# Patient Record
Sex: Female | Born: 1995
Health system: Southern US, Community
[De-identification: ages and names within clinical notes are randomized; demographics above are authoritative.]

## PROBLEM LIST (undated history)

## (undated) DIAGNOSIS — D649 Anemia, unspecified: Secondary | ICD-10-CM

## (undated) DIAGNOSIS — K589 Irritable bowel syndrome without diarrhea: Secondary | ICD-10-CM

## (undated) DIAGNOSIS — G932 Benign intracranial hypertension: Secondary | ICD-10-CM

## (undated) DIAGNOSIS — K644 Residual hemorrhoidal skin tags: Secondary | ICD-10-CM

## (undated) DIAGNOSIS — G43909 Migraine, unspecified, not intractable, without status migrainosus: Secondary | ICD-10-CM

## (undated) DIAGNOSIS — J45909 Unspecified asthma, uncomplicated: Secondary | ICD-10-CM

## (undated) DIAGNOSIS — F419 Anxiety disorder, unspecified: Secondary | ICD-10-CM

## (undated) HISTORY — DX: Anemia, unspecified: D64.9

## (undated) HISTORY — DX: Benign intracranial hypertension: G93.2

## (undated) HISTORY — DX: Residual hemorrhoidal skin tags: K64.4

## (undated) HISTORY — PX: WISDOM TOOTH EXTRACTION: SHX21

## (undated) HISTORY — DX: Migraine, unspecified, not intractable, without status migrainosus: G43.909

## (undated) HISTORY — DX: Anxiety disorder, unspecified: F41.9

## (undated) HISTORY — PX: OTHER SURGICAL HISTORY: SHX169

---
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
## 2016-02-15 ENCOUNTER — Ambulatory Visit (HOSPITAL_COMMUNITY)
Admission: EM | Admit: 2016-02-15 | Discharge: 2016-02-15 | Disposition: A | Discharge status: Home / Self Care | Payer: BLUE CROSS/BLUE SHIELD | Attending: Family Medicine | Admitting: Family Medicine

## 2016-02-15 ENCOUNTER — Encounter (HOSPITAL_COMMUNITY): Payer: Self-pay | Admitting: *Deleted

## 2016-02-15 DIAGNOSIS — S0093XA Contusion of unspecified part of head, initial encounter: Secondary | ICD-10-CM | POA: Diagnosis not present

## 2016-02-15 DIAGNOSIS — S80211A Abrasion, right knee, initial encounter: Secondary | ICD-10-CM
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
## 2016-02-15 NOTE — Discharge Instructions (Signed)
If you develop any new symptoms or vomiting or worsening headache you need to be reevaluated promptly.

## 2016-03-12 DIAGNOSIS — M9901 Segmental and somatic dysfunction of cervical region: Secondary | ICD-10-CM | POA: Diagnosis not present

## 2016-03-12 DIAGNOSIS — G44209 Tension-type headache, unspecified, not intractable: Secondary | ICD-10-CM | POA: Diagnosis not present

## 2016-03-12 DIAGNOSIS — M542 Cervicalgia: Secondary | ICD-10-CM | POA: Diagnosis not present

## 2016-03-14 DIAGNOSIS — M542 Cervicalgia: Secondary | ICD-10-CM | POA: Diagnosis not present

## 2016-03-14 DIAGNOSIS — M9901 Segmental and somatic dysfunction of cervical region: Secondary | ICD-10-CM | POA: Diagnosis not present

## 2016-03-14 DIAGNOSIS — G44209 Tension-type headache, unspecified, not intractable: Secondary | ICD-10-CM | POA: Diagnosis not present

## 2016-03-14 DIAGNOSIS — M9902 Segmental and somatic dysfunction of thoracic region: Secondary | ICD-10-CM | POA: Diagnosis not present

## 2016-03-16 DIAGNOSIS — G44209 Tension-type headache, unspecified, not intractable: Secondary | ICD-10-CM | POA: Diagnosis not present

## 2016-03-16 DIAGNOSIS — M9901 Segmental and somatic dysfunction of cervical region: Secondary | ICD-10-CM | POA: Diagnosis not present

## 2016-03-16 DIAGNOSIS — M9902 Segmental and somatic dysfunction of thoracic region: Secondary | ICD-10-CM | POA: Diagnosis not present

## 2016-03-16 DIAGNOSIS — M542 Cervicalgia: Secondary | ICD-10-CM | POA: Diagnosis not present

## 2016-03-19 DIAGNOSIS — M542 Cervicalgia: Secondary | ICD-10-CM | POA: Diagnosis not present

## 2016-03-19 DIAGNOSIS — G44209 Tension-type headache, unspecified, not intractable: Secondary | ICD-10-CM | POA: Diagnosis not present

## 2016-03-19 DIAGNOSIS — M9901 Segmental and somatic dysfunction of cervical region: Secondary | ICD-10-CM | POA: Diagnosis not present

## 2016-03-21 DIAGNOSIS — M9901 Segmental and somatic dysfunction of cervical region: Secondary | ICD-10-CM | POA: Diagnosis not present

## 2016-03-21 DIAGNOSIS — G44209 Tension-type headache, unspecified, not intractable: Secondary | ICD-10-CM | POA: Diagnosis not present

## 2016-03-21 DIAGNOSIS — M9902 Segmental and somatic dysfunction of thoracic region: Secondary | ICD-10-CM | POA: Diagnosis not present

## 2016-03-21 DIAGNOSIS — M542 Cervicalgia: Secondary | ICD-10-CM | POA: Diagnosis not present

## 2016-03-28 DIAGNOSIS — M9902 Segmental and somatic dysfunction of thoracic region: Secondary | ICD-10-CM | POA: Diagnosis not present

## 2016-03-28 DIAGNOSIS — G44209 Tension-type headache, unspecified, not intractable: Secondary | ICD-10-CM | POA: Diagnosis not present

## 2016-03-28 DIAGNOSIS — M9901 Segmental and somatic dysfunction of cervical region: Secondary | ICD-10-CM | POA: Diagnosis not present

## 2016-03-29 DIAGNOSIS — M9902 Segmental and somatic dysfunction of thoracic region: Secondary | ICD-10-CM | POA: Diagnosis not present

## 2016-03-29 DIAGNOSIS — M9901 Segmental and somatic dysfunction of cervical region: Secondary | ICD-10-CM | POA: Diagnosis not present

## 2016-03-29 DIAGNOSIS — G44209 Tension-type headache, unspecified, not intractable: Secondary | ICD-10-CM | POA: Diagnosis not present

## 2016-03-29 DIAGNOSIS — M542 Cervicalgia: Secondary | ICD-10-CM | POA: Diagnosis not present

## 2016-04-02 DIAGNOSIS — M542 Cervicalgia: Secondary | ICD-10-CM | POA: Diagnosis not present

## 2016-04-02 DIAGNOSIS — M9901 Segmental and somatic dysfunction of cervical region: Secondary | ICD-10-CM | POA: Diagnosis not present

## 2016-04-02 DIAGNOSIS — G44209 Tension-type headache, unspecified, not intractable: Secondary | ICD-10-CM | POA: Diagnosis not present

## 2016-04-04 DIAGNOSIS — M9902 Segmental and somatic dysfunction of thoracic region: Secondary | ICD-10-CM | POA: Diagnosis not present

## 2016-04-04 DIAGNOSIS — M9901 Segmental and somatic dysfunction of cervical region: Secondary | ICD-10-CM | POA: Diagnosis not present

## 2016-04-04 DIAGNOSIS — G44209 Tension-type headache, unspecified, not intractable: Secondary | ICD-10-CM | POA: Diagnosis not present

## 2016-04-04 DIAGNOSIS — M542 Cervicalgia: Secondary | ICD-10-CM | POA: Diagnosis not present

## 2016-04-09 DIAGNOSIS — M542 Cervicalgia: Secondary | ICD-10-CM | POA: Diagnosis not present

## 2016-04-09 DIAGNOSIS — G44209 Tension-type headache, unspecified, not intractable: Secondary | ICD-10-CM | POA: Diagnosis not present

## 2016-04-09 DIAGNOSIS — M9902 Segmental and somatic dysfunction of thoracic region: Secondary | ICD-10-CM | POA: Diagnosis not present

## 2016-04-09 DIAGNOSIS — M9901 Segmental and somatic dysfunction of cervical region: Secondary | ICD-10-CM | POA: Diagnosis not present

## 2016-07-04 DIAGNOSIS — R05 Cough: Secondary | ICD-10-CM | POA: Diagnosis not present

## 2016-07-04 DIAGNOSIS — J029 Acute pharyngitis, unspecified: Secondary | ICD-10-CM | POA: Diagnosis not present

## 2016-07-04 DIAGNOSIS — Z6830 Body mass index (BMI) 30.0-30.9, adult: Secondary | ICD-10-CM | POA: Diagnosis not present

## 2016-12-24 DIAGNOSIS — R300 Dysuria: Secondary | ICD-10-CM | POA: Diagnosis not present

## 2016-12-24 DIAGNOSIS — Z113 Encounter for screening for infections with a predominantly sexual mode of transmission: Secondary | ICD-10-CM | POA: Diagnosis not present

## 2016-12-24 DIAGNOSIS — Z23 Encounter for immunization: Secondary | ICD-10-CM | POA: Diagnosis not present

## 2016-12-24 DIAGNOSIS — Z124 Encounter for screening for malignant neoplasm of cervix: Secondary | ICD-10-CM | POA: Diagnosis not present

## 2016-12-24 DIAGNOSIS — Z0000 Encounter for general adult medical examination without abnormal findings: Secondary | ICD-10-CM | POA: Diagnosis not present

## 2017-04-01 DIAGNOSIS — R55 Syncope and collapse: Secondary | ICD-10-CM | POA: Diagnosis not present

## 2017-04-01 DIAGNOSIS — R112 Nausea with vomiting, unspecified: Secondary | ICD-10-CM | POA: Diagnosis not present

## 2017-06-24 DIAGNOSIS — Z6829 Body mass index (BMI) 29.0-29.9, adult: Secondary | ICD-10-CM | POA: Diagnosis not present

## 2017-06-24 DIAGNOSIS — H6093 Unspecified otitis externa, bilateral: Secondary | ICD-10-CM | POA: Diagnosis not present

## 2017-07-23 ENCOUNTER — Ambulatory Visit (HOSPITAL_COMMUNITY)
Admission: EM | Admit: 2017-07-23 | Discharge: 2017-07-23 | Disposition: A | Discharge status: Home / Self Care | Payer: BLUE CROSS/BLUE SHIELD | Attending: Family Medicine | Admitting: Family Medicine

## 2017-07-23 ENCOUNTER — Encounter (HOSPITAL_COMMUNITY): Payer: Self-pay | Admitting: Emergency Medicine

## 2017-07-23 DIAGNOSIS — J00 Acute nasopharyngitis [common cold]: Secondary | ICD-10-CM

## 2017-07-23 HISTORY — DX: Unspecified asthma, uncomplicated: J45.909

## 2017-07-23 MED ORDER — CROMOLYN SODIUM 5.2 MG/ACT NA AERS: 1.0000 | INHALATION_SPRAY | Freq: Four times a day (QID) | NASAL | 12 refills | Status: DC

## 2017-07-23 MED ORDER — CETIRIZINE-PSEUDOEPHEDRINE ER 5-120 MG PO TB12: 1.0000 | ORAL_TABLET | Freq: Every day | ORAL | 0 refills | Status: DC

## 2017-07-23 NOTE — ED Triage Notes (Signed)
Pt c/o fever, coughing, sinus issues x3 days. (fever of 99) c/o body aches, HA.

## 2017-07-23 NOTE — Discharge Instructions (Signed)
Push fluids to ensure adequate hydration and keep secretions thin.  Tylenol and/or ibuprofen as needed for pain or fevers.  Nasal spray 4 times a day as needed for congestion symptoms. Daily zyrtec d. If symptoms worsen or do not improve in the next week to return to be seen or to follow up with your primary care provider.

## 2017-07-23 NOTE — ED Provider Notes (Signed)
[facility]    CSN: [id number] Arrival date & time: 07/23/17  1752     History   Chief Complaint Chief Complaint  Patient presents with  . URI    HPI Pamela BirchwoodKrysten Montoya is a 22 y.o. female.   Tonye PearsonKrysten presents with complaints of runny nose, sore throat, headache, body aches, non productive cough, mild nausea and loose stools, which started 3 days ago. Roommates have had similar illness but symptoms improved more quickly. Denies abdominal pain or vomiting. Denies urinary symptoms. Temps of 99. Symptoms have not worsened but have not improved. History of asthma. Mild shortness of breath, without wheezing. Took dayquil today at 1100, Ibuprofen yesterday. Has not helped.    ROS per HPI.       Past Medical History:  Diagnosis Date  . Asthma     There are no active problems to display for this patient.   History reviewed. No pertinent surgical history.  OB History    Gravida Para Term Preterm AB Living   1             SAB TAB Ectopic Multiple Live Births                   Home Medications    Prior to Admission medications   Medication Sig Start Date End Date Taking? Authorizing Provider  albuterol (PROVENTIL HFA;VENTOLIN HFA) 108 (90 Base) MCG/ACT inhaler Inhale into the lungs every 6 (six) hours as needed for wheezing or shortness of breath.   Yes [provider]  cetirizine-pseudoephedrine (ZYRTEC-D) 5-120 MG tablet Take 1 tablet by mouth daily. 07/23/17   Georgetta HaberBurky, Natalie B, NP  cromolyn (NASALCROM) 5.2 MG/ACT nasal spray Place 1 spray into both nostrils 4 (four) times daily. 07/23/17   Georgetta HaberBurky, Natalie B, NP    Family History No family history on file.  Social History Social History   Tobacco Use  . Smoking status: Never Smoker  Substance Use Topics  . Alcohol use: Yes  . Drug use: Not on file     Allergies   Patient has no known allergies.   Review of Systems Review of Systems   Physical Exam Triage Vital Signs ED Triage Vitals  [07/23/17 1803]  Enc Vitals Group     BP 118/77     Pulse Rate 86     Resp 18     Temp 98.4 F (36.9 C)     Temp src      SpO2 100 %     Weight      Height      Head Circumference      Peak Flow      Pain Score 4     Pain Loc      Pain Edu?      Excl. in GC?    No data found.  Updated Vital Signs BP 118/77   Pulse 86   Temp 98.4 F (36.9 C)   Resp 18   LMP 07/09/2017   SpO2 100%   Breastfeeding? Unknown   Visual Acuity Right Eye Distance:   Left Eye Distance:   Bilateral Distance:    Right Eye Near:   Left Eye Near:    Bilateral Near:     Physical Exam  Constitutional: She is oriented to person, place, and time. She appears well-developed and well-nourished. No distress.  HENT:  Head: Normocephalic and atraumatic.  Right Ear: Tympanic membrane, external ear and ear canal normal.  Left Ear: Tympanic membrane, external  ear and ear canal normal.  Nose: Rhinorrhea present. Right sinus exhibits no maxillary sinus tenderness and no frontal sinus tenderness. Left sinus exhibits no maxillary sinus tenderness and no frontal sinus tenderness.  Mouth/Throat: Uvula is midline, oropharynx is clear and moist and mucous membranes are normal. No tonsillar exudate.  Eyes: Conjunctivae and EOM are normal. Pupils are equal, round, and reactive to light.  Cardiovascular: Normal rate, regular rhythm and normal heart sounds.  Pulmonary/Chest: Effort normal and breath sounds normal.  Lymphadenopathy:    She has no cervical adenopathy.  Neurological: She is alert and oriented to person, place, and time.  Skin: Skin is warm and dry.     UC Treatments / Results  Labs (all labs ordered are listed, but only abnormal results are displayed) Labs Reviewed - No data to display  EKG  EKG Interpretation None       Radiology No results found.  Procedures Procedures (including critical care time)  Medications Ordered in UC Medications - No data to display   Initial  Impression / Assessment and Plan / UC Course  I have reviewed the triage vital signs and the nursing notes.  Pertinent labs & imaging results that were available during my care of the patient were reviewed by me and considered in my medical decision making (see chart for details).     Benign physical findings. History and physical consistent with viral illness. Supportive cares recommended. Push fluids to ensure adequate hydration and keep secretions thin. Tylenol and/or ibuprofen as needed for pain or fevers.  Zyrtec d, nasalcrom for congestion. If symptoms worsen or do not improve in the next week to return to be seen or to follow up with PCP.  Patient verbalized understanding and agreeable to plan.    Final Clinical Impressions(s) / UC Diagnoses   Final diagnoses:  Acute nasopharyngitis    ED Discharge Orders        Ordered    cetirizine-pseudoephedrine (ZYRTEC-D) 5-120 MG tablet  Daily     07/23/17 1829    cromolyn (NASALCROM) 5.2 MG/ACT nasal spray  4 times daily     07/23/17 1829       Controlled Substance Prescriptions [place] Controlled Substance Registry consulted? Not Applicable   Georgetta Haber, NP 07/23/17 [phone]

## 2018-01-28 ENCOUNTER — Encounter: Payer: Self-pay | Admitting: Physician Assistant

## 2018-01-28 ENCOUNTER — Ambulatory Visit: Payer: BLUE CROSS/BLUE SHIELD | Admitting: Physician Assistant

## 2018-01-28 ENCOUNTER — Other Ambulatory Visit: Payer: Self-pay

## 2018-01-28 VITALS — BP 116/79 | HR 88 | Temp 99.2°F | Resp 20 | Ht 61.81 in | Wt 169.0 lb

## 2018-01-28 DIAGNOSIS — H1131 Conjunctival hemorrhage, right eye: Secondary | ICD-10-CM | POA: Diagnosis not present

## 2018-01-28 NOTE — Patient Instructions (Addendum)
Apply cold compress to affected area at least 3-5 x a day. Avoid straining eye for the next few days. If you start to experience any headache, vision loss, or vomiting, please seek care immediately.   Subconjunctival Hemorrhage Subconjunctival hemorrhage is bleeding that happens between the white part of your eye (sclera) and the clear membrane that covers the outside of your eye (conjunctiva). There are many tiny blood vessels near the surface of your eye. A subconjunctival hemorrhage happens when one or more of these vessels breaks and bleeds, causing a red patch to appear on your eye. This is similar to a bruise. Depending on the amount of bleeding, the red patch may only cover a small area of your eye or it may cover the entire visible part of the sclera. If a lot of blood collects under the conjunctiva, there may also be swelling. Subconjunctival hemorrhages do not affect your vision or cause pain, but your eye may feel irritated if there is swelling. Subconjunctival hemorrhages usually do not require treatment, and they disappear on their own within two weeks. What are the causes? This condition may be caused by:  Mild trauma, such as rubbing your eye too hard.  Severe trauma or blunt injuries.  Coughing, sneezing, or vomiting.  Straining, such as when lifting a heavy object.  High blood pressure.  Recent eye surgery.  A history of diabetes.  Certain medicines, especially blood thinners (anticoagulants).  Other conditions, such as eye tumors, bleeding disorders, or blood vessel abnormalities.  Subconjunctival hemorrhages can happen without an obvious cause. What are the signs or symptoms? Symptoms of this condition include:  A bright red or dark red patch on the white part of the eye. ? The red area may spread out to cover a larger area of the eye before it goes away. ? The red area may turn brownish-yellow before it goes away.  Swelling.  Mild eye irritation.  How is this  diagnosed? This condition is diagnosed with a physical exam. If your subconjunctival hemorrhage was caused by trauma, your health care provider may refer you to an eye specialist (ophthalmologist) or another specialist to check for other injuries. You may have other tests, including:  An eye exam.  A blood pressure check.  Blood tests to check for bleeding disorders.  If your subconjunctival hemorrhage was caused by trauma, X-rays or a CT scan may be done to check for other injuries. How is this treated? Usually, no treatment is needed. Your health care provider may recommend eye drops or cold compresses to help with discomfort. Follow these instructions at home:  Take over-the-counter and prescription medicines only as directed by your health care provider.  Use eye drops or cold compresses to help with discomfort as directed by your health care provider.  Avoid activities, things, and environments that may irritate or injure your eye.  Keep all follow-up visits as told by your health care provider. This is important. Contact a health care provider if:  You have pain in your eye.  The bleeding does not go away within 3 weeks.  You keep getting new subconjunctival hemorrhages. Get help right away if:  Your vision changes or you have difficulty seeing.  You suddenly develop severe sensitivity to light.  You develop a severe headache, persistent vomiting, confusion, or abnormal tiredness (lethargy).  Your eye seems to bulge or protrude from your eye socket.  You develop unexplained bruises on your body.  You have unexplained bleeding in another area of your  body. This information is not intended to replace advice given to you by your health care provider. Make sure you discuss any questions you have with your health care provider. Document Released: 06/18/2005 Document Revised: 02/12/2016 Document Reviewed: 08/25/2014 Elsevier Interactive Patient Education  2018 [employer]  IF you received an x-ray today, you will receive an invoice from [facility]. Please contact [facility] at [phone] with questions or concerns regarding your invoice.   IF you received labwork today, you will receive an invoice from [facility]. Please contact [facility] at [phone] with questions or concerns regarding your invoice.   Our billing staff will not be able to assist you with questions regarding bills from these companies.  You will be contacted with the lab results as soon as they are available. The fastest way to get your results is to activate your My Chart account. Instructions are located on the last page of this paperwork. If you have not heard from us regarding the results in 2 weeks, please contact this office.

## 2018-01-28 NOTE — Progress Notes (Signed)
Pamela Montoya  MRN: [id number] DOB: [birth year]  Subjective:  Pamela Montoya is a 22 y.o. female seen in office today for a chief complaint of "blood in right eye" x 1 day. Her boyfriend noticed when he got home today for lunch. Has some mild irritation when looking at bright light. Denies pain, foreign body sensation, visual disturbance, headache, discharge, and itching. Denies acute trauma. Denies contact lens use. Has increased her amount of time spent looking at computer screen over the past month. That is what she was doing when her boyfriend came home. Has PMH of pseudotumor cerebri. Denies use of anticoagulation. Denies smoking.   Review of Systems  Constitutional: Negative for chills, diaphoresis and fever.  Gastrointestinal: Negative for nausea and vomiting.  Neurological: Negative for dizziness, numbness and headaches.    There are no active problems to display for this patient.   Current Outpatient Medications on File Prior to Visit  Medication Sig Dispense Refill  . albuterol (PROVENTIL HFA;VENTOLIN HFA) 108 (90 Base) MCG/ACT inhaler Inhale into the lungs every 6 (six) hours as needed for wheezing or shortness of breath.    . cetirizine-pseudoephedrine (ZYRTEC-D) 5-120 MG tablet Take 1 tablet by mouth daily. (Patient not taking: Reported on 01/28/2018) 30 tablet 0  . cromolyn (NASALCROM) 5.2 MG/ACT nasal spray Place 1 spray into both nostrils 4 (four) times daily. (Patient not taking: Reported on 01/28/2018) 26 mL 12   No current facility-administered medications on file prior to visit.     No Known Allergies   Objective:  BP 116/79   Pulse 88   Temp 99.2 F (37.3 C) (Oral)   Resp 20   Ht 5' 1.81" (1.57 m)   Wt 169 lb (76.7 kg)   LMP 01/08/2018 (Exact Date)   SpO2 98%   Breastfeeding? No   BMI 31.10 kg/m   Physical Exam  Constitutional: She is oriented to person, place, and time. She appears well-developed and well-nourished.  HENT:  Head: Normocephalic and  atraumatic.  No pain with palpation of bilateral periorbital regions.   Eyes: Pupils are equal, round, and reactive to light. EOM are normal. Right eye exhibits no discharge and no hordeolum. No foreign body present in the right eye. Right conjunctiva has a hemorrhage (subconjuntival hemorrhage in superior medial aspect of right conjunctiva). Left conjunctiva is not injected. Left conjunctiva has no hemorrhage.  Fundoscopic exam:      The right eye shows no AV nicking, no exudate, no hemorrhage and no papilledema. The right eye shows red reflex.       The left eye shows no AV nicking, no exudate, no hemorrhage and no papilledema. The left eye shows red reflex.  Neck: Normal range of motion.  Pulmonary/Chest: Effort normal.  Neurological: She is alert and oriented to person, place, and time.  Skin: Skin is warm and dry.  Psychiatric: She has a normal mood and affect.  Vitals reviewed.     Visual Acuity Screening   Right eye Left eye Both eyes  Without correction:     With correction: 20/20 20/15 20/15      Assessment and Plan :  1. Subconjunctival hemorrhage of right eye Hx and PE findings consistent with subconjunctival hemorrhage. No red flags noted. Visual acuity normal. Rec rest, avoid screen time for the next few days, and use cool compresses to affected area. Advised to return to clinic if symptoms worsen, do not improve, or as needed.   Benjiman Core PA-C  [facility] at [facility] 01/28/2018 5:26 PM

## 2018-05-06 ENCOUNTER — Ambulatory Visit: Payer: BLUE CROSS/BLUE SHIELD | Admitting: Physician Assistant

## 2018-05-06 ENCOUNTER — Encounter: Payer: Self-pay | Admitting: Physician Assistant

## 2018-05-06 ENCOUNTER — Other Ambulatory Visit: Payer: Self-pay

## 2018-05-06 VITALS — BP 107/71 | HR 86 | Temp 98.5°F | Resp 18 | Ht 61.81 in | Wt 170.6 lb

## 2018-05-06 DIAGNOSIS — N946 Dysmenorrhea, unspecified: Secondary | ICD-10-CM | POA: Diagnosis not present

## 2018-05-06 DIAGNOSIS — R112 Nausea with vomiting, unspecified: Secondary | ICD-10-CM | POA: Diagnosis not present

## 2018-05-06 DIAGNOSIS — H938X3 Other specified disorders of ear, bilateral: Secondary | ICD-10-CM | POA: Diagnosis not present

## 2018-05-06 LAB — POCT URINE PREGNANCY: Preg Test, Ur: NEGATIVE

## 2018-05-06 MED ORDER — ONDANSETRON HCL 4 MG PO TABS: 4.0000 mg | ORAL_TABLET | Freq: Three times a day (TID) | ORAL | 0 refills | Status: DC | PRN

## 2018-05-06 MED ORDER — CELECOXIB 200 MG PO CAPS
ORAL_CAPSULE | ORAL | 1 refills | Status: DC
Start: 2018-05-06 — End: 2018-05-16

## 2018-05-06 NOTE — Patient Instructions (Addendum)
Please follow up with gynecology.  For future cramps, use celebrex as prescribed. Do not take with ibuprofen. May use zofran as needed for nausea.      If you have lab work done today you will be contacted with your lab results within the next 2 weeks.  If you have not heard from [place] then please contact us. The fastest way to get your results is to register for My Chart.   Dysmenorrhea Dysmenorrhea means painful cramps during your period (menstrual period). You will have pain in your lower belly (abdomen). The pain is caused by the tightening (contracting) of the muscles of the womb (uterus). The pain may be mild or very bad. With this condition, you may:  Have a headache.  Feel sick to your stomach (nauseous).  Throw up (vomit).  Have lower back pain.  Follow these instructions at home: Helping pain and cramping  Put heat on your lower back or belly when you have pain or cramps. Use the heat source that your doctor tells you to use. ? Place a towel between your skin and the heat. ? Leave the heat on for 20-30 minutes. ? Remove the heat if your skin turns bright red. This is especially important if you cannot feel pain, heat, or cold. ? Do not have a heating pad on during sleep.  Do aerobic exercises. These include walking, swimming, or biking. These may help with cramps.  Massage your lower back or belly. This may help lessen pain. General instructions  Take over-the-counter and prescription medicines only as told by your doctor.  Do not drive or use heavy machinery while taking prescription pain medicine.  Avoid alcohol and caffeine during and right before your period. These can make cramps worse.  Do not use any products that have nicotine or tobacco. These include cigarettes and e-cigarettes. If you need help quitting, ask your doctor.  Keep all follow-up visits as told by your doctor. This is important. Contact a doctor if:  You have pain that gets worse.  You have  pain that does not get better with medicine.  You have pain during sex.  You feel sick to your stomach or you throw up during your period, and medicine does not help. Get help right away if:  You pass out (faint). Summary  Dysmenorrhea means painful cramps during your period (menstrual period).  Put heat on your lower back or belly when you have pain or cramps.  Do exercises like walking, swimming, or biking to help with cramps.  Contact a doctor if you have pain during sex. This information is not intended to replace advice given to you by your health care provider. Make sure you discuss any questions you have with your health care provider. Document Released: 09/14/2008 Document Revised: 07/05/2016 Document Reviewed: 07/05/2016 Elsevier Interactive Patient Education  2017 [employer].  IF you received an x-ray today, you will receive an invoice from [facility]. Please contact [facility] at [phone] with questions or concerns regarding your invoice.   IF you received labwork today, you will receive an invoice from [facility]. Please contact [facility] at [phone] with questions or concerns regarding your invoice.   Our billing staff will not be able to assist you with questions regarding bills from these companies.  You will be contacted with the lab results as soon as they are available. The fastest way to get your results is to activate your My Chart account. Instructions are located on the last page of this  paperwork. If you have not heard from [place] regarding the results in 2 weeks, please contact this office.

## 2018-05-06 NOTE — Progress Notes (Signed)
MRN: [id number] DOB: 04/26/1996  Subjective:   Pamela Montoya is a 22 y.o. female presenting for chief complaint of Dysmenorrhea (having vomiting episodes when she has her cycle since august also having some pressure in ears for 2 weeks ) .  Having nausea and vomiting during menstrual cycle since they started when she was a teenager. Seemed to be more manageable but for the past 2 months it has worsened again. Has very bad cramps and heavy cycles, has to take a lot of ibuprofen to control it, it does help.  Cycles last 5-6 days, uses 3-6 pads a day. They are regular, occurring every 28 days. LMP 05/03/18.  Pain, heaviness, nausea, and vomiting are worse on days 2 through 4 of her cycle.  Has tried birth control in the past and this did help but was told not to use these anymore due to her pseudotumor cerebri.  She would like a referral to gynecology to discuss further options.  She is sexually active with monogamous boyfriend.  No concern for STDs as she just had this recently.  Having some denies any other aggravating or relieving factors, no other questions or concerns.  Review of Systems  Constitutional: Negative for chills and fever.  HENT: Positive for congestion. Negative for ear discharge, ear pain, hearing loss and tinnitus.        +ear fullness for ~2 weeks  Neurological: Negative for dizziness.    Pamela Montoya has a current medication list which includes the following prescription(s): albuterol, celecoxib, and ondansetron. Also has No Known Allergies.  Pamela Montoya  has a past medical history of Asthma and Pseudotumor cerebri (2007). Also  has no past surgical history on file.   Objective:   Vitals: BP 107/71   Pulse 86   Temp 98.5 F (36.9 C) (Oral)   Resp 18   Ht 5' 1.81" (1.57 m)   Wt 170 lb 9.6 oz (77.4 kg)   LMP 04/29/2018   SpO2 98%   BMI 31.40 kg/m   Physical Exam  Constitutional: She is oriented to person, place, and time. She appears well-developed and  well-nourished. No distress.  HENT:  Head: Normocephalic and atraumatic.  Right Ear: Tympanic membrane, external ear and ear canal normal. No tenderness. Tympanic membrane is not erythematous and not bulging. No middle ear effusion.  Left Ear: Tympanic membrane, external ear and ear canal normal. No tenderness. Tympanic membrane is not erythematous and not bulging.  No middle ear effusion.  Eyes: Conjunctivae are normal.  Neck: Normal range of motion.  Cardiovascular: Normal rate, regular rhythm and normal heart sounds.  Pulmonary/Chest: Effort normal.  Abdominal: Soft. Bowel sounds are normal. She exhibits no mass. There is no tenderness. There is no guarding.  Lymphadenopathy:       Head (right side): No submental, no submandibular, no tonsillar, no preauricular, no posterior auricular and no occipital adenopathy present.       Head (left side): No submental, no submandibular, no tonsillar, no preauricular, no posterior auricular and no occipital adenopathy present.    She has no cervical adenopathy.  Neurological: She is alert and oriented to person, place, and time.  Skin: Skin is warm and dry.  Psychiatric: She has a normal mood and affect.  Vitals reviewed.   Results for orders placed or performed in visit on 05/06/18 (from the past 24 hour(s))  POCT urine pregnancy     Status: None   Collection Time: 05/06/18  5:04 PM  Result Value Ref Range  Preg Test, Ur Negative Negative    Assessment and Plan :  1. Nausea and vomiting, intractability of vomiting not specified, unspecified vomiting type - POCT urine pregnancy - ondansetron (ZOFRAN) 4 MG tablet; Take 1 tablet (4 mg total) by mouth every 8 (eight) hours as needed for nausea or vomiting.  Dispense: 20 tablet; Refill: 0 - Ambulatory referral to Gynecology 2. Dysmenorrhea Patient is asymptomatic today but history is consistent with moderate to severe dysmenorrhea.  Will attempt trial of Celebrex and Zofran as needed during her  menstrual cycles.  She has had success with OCP in the past.  Has past medical history of pseudotumor cerebri and prefers to discuss her options for further management of dysmenorrhea with a gynecologist.  Referral has been placed.  Follow-up here as needed. - celecoxib (CELEBREX) 200 MG capsule; Take 2 tablets by mouth on day one of menses (may take 1 additional tablet on day one if needed), then take 1 tablet twice daily for remainder of menstrual cycle.  Dispense: 30 capsule; Refill: 1 - Ambulatory referral to Gynecology  3. Ear fullness, bilateral Normal physical exam findings.  Likely due to patient seasonal allergies.   Benjiman Core, PA-C  [facility] at [facility] 05/06/2018 5:05 PM

## 2018-05-12 ENCOUNTER — Ambulatory Visit: Payer: BLUE CROSS/BLUE SHIELD | Admitting: Physician Assistant

## 2018-05-16 ENCOUNTER — Encounter: Payer: Self-pay | Admitting: Gynecology

## 2018-05-16 ENCOUNTER — Ambulatory Visit: Payer: BLUE CROSS/BLUE SHIELD | Admitting: Gynecology

## 2018-05-16 VITALS — BP 118/68 | Ht 61.0 in | Wt 171.0 lb

## 2018-05-16 DIAGNOSIS — N906 Unspecified hypertrophy of vulva: Secondary | ICD-10-CM | POA: Diagnosis not present

## 2018-05-16 DIAGNOSIS — R112 Nausea with vomiting, unspecified: Secondary | ICD-10-CM

## 2018-05-16 DIAGNOSIS — N946 Dysmenorrhea, unspecified: Secondary | ICD-10-CM

## 2018-05-16 DIAGNOSIS — Z01419 Encounter for gynecological examination (general) (routine) without abnormal findings: Secondary | ICD-10-CM

## 2018-05-16 MED ORDER — IBUPROFEN 800 MG PO TABS: 800.0000 mg | ORAL_TABLET | Freq: Three times a day (TID) | ORAL | 1 refills | Status: DC | PRN

## 2018-05-16 NOTE — Patient Instructions (Signed)
Follow up for the ultrasound and H pylori test as scheduled

## 2018-05-16 NOTE — Progress Notes (Signed)
Pamela Montoya 10/27/1995 [id number]        22 y.o.  G0P0 new patient presents complaining of nausea/vomiting with her menses.  Patient notes that she is always had some nausea with her menses since menarche but since August she has had multiple episodes of vomiting for several days during her cycle.  Has regular monthly menses.  Condom contraception.  Had tried oral contraceptives in the past historically and had nausea with them.  She does note though between her menses she has some lingering nausea and diarrhea.  History of pseudotumor cerebri  Past medical history,surgical history, problem list, medications, allergies, family history and social history were all reviewed and documented as reviewed in the EPIC chart.  ROS:  Performed with pertinent positives and negatives included in the history, assessment and plan.   Additional significant findings : None   Exam: Pamela PortelaKim Montoya assistant Vitals:   05/16/18 1110  BP: 118/68  Weight: 171 lb (77.6 kg)  Height: 5\' 1"  (1.549 m)   Body mass index is 32.31 kg/m.  General appearance:  Normal affect, orientation and appearance. Skin: Grossly normal HEENT: Without gross lesions.  No cervical or supraclavicular adenopathy. Thyroid normal.  Lungs:  Clear without wheezing, rales or rhonchi Cardiac: RR, without RMG Abdominal:  Soft, nontender, without masses, guarding, rebound, organomegaly or hernia Breasts:  Examined lying and sitting without masses, retractions, discharge or axillary adenopathy. Pelvic:  Ext, BUS, Vagina: With left labia minora hypertrophy.  Cervix: Normal  Uterus: Anteverted, normal size, shape and contour, midline and mobile nontender   Adnexa: Without masses or tenderness    Anus and perineum: Normal     Assessment/Plan:  22 y.o. G0P0 female for annual gynecologic exam with regular menses, condom contraception.   1. Nausea and vomiting with her menses worsening over the last several cycles.  Some underlying nausea  in between with diarrhea.  Discussed possible GI etiology exacerbated with her menses.  Also having significant dysmenorrhea.  Several different options were reviewed with the patient.  Ultimately we will start with baseline ultrasound rule out nonpalpable abnormalities.  Her sister has a diagnosis of endometriosis.  Discussed possible cause for her dysmenorrhea.  Limits of ultrasound and diagnosis of endometriosis was also discussed with options for diagnostic laparoscopy.  We will start with pain management with ibuprofen 800 mg every 8 hours started at the very onset of cramping and to continue for several days.  Will see if this does not also help with her nausea and blunting prostaglandin release.  Given that she has some nausea in between her menses possibilities for GI disease discussed.  She was diagnosed with IBS in the past.  Will have her do an H. pylori breath test when she returns for the ultrasound to rule out etiology for her symptoms.  Will consider GI referral if continues to be an issue.  Lastly we discussed low-dose oral contraceptives for menstrual suppression and hopefully symptom relief.  Did have issues with nausea with a trial of birth control pills.  They were unclear whether this was related to her pseudotumor cerebri.  I discussed trial of 1/10 pill is the lowest estrogen-based pill out there to see if this does not help.  Will rediscuss this when she returns for her ultrasound. 2. Breast health.  Breast exam normal today.  SBE monthly reviewed. 3. Left labial hypertrophy.  Onset with puberty.  Stable as far as no ongoing enlargement.  Options for management to include expectant versus surgical discussed.  Patient's comfortable with observation at this time as it does not bother her. 4. Reports Gardasil series received. 5. STD screening discussed and offered.  Patient declined.  Relates testing before her current relationship which is monogamous. 6. Health maintenance.  No routine lab  work done as patient does this through her primary provider.  Follow-up for ultrasound and H. pylori testing.   Pamela Lords MD, 11:34 AM 05/16/2018

## 2018-06-16 ENCOUNTER — Other Ambulatory Visit: Payer: Self-pay | Admitting: Gynecology

## 2018-06-16 ENCOUNTER — Ambulatory Visit: Payer: BLUE CROSS/BLUE SHIELD

## 2018-06-16 ENCOUNTER — Ambulatory Visit (INDEPENDENT_AMBULATORY_CARE_PROVIDER_SITE_OTHER): Payer: BLUE CROSS/BLUE SHIELD | Admitting: Gynecology

## 2018-06-16 ENCOUNTER — Encounter: Payer: Self-pay | Admitting: Gynecology

## 2018-06-16 VITALS — BP 118/72

## 2018-06-16 DIAGNOSIS — N831 Corpus luteum cyst of ovary, unspecified side: Secondary | ICD-10-CM

## 2018-06-16 DIAGNOSIS — N946 Dysmenorrhea, unspecified: Secondary | ICD-10-CM

## 2018-06-16 NOTE — Patient Instructions (Signed)
Follow-up with your decision as far as contraception.

## 2018-06-16 NOTE — Progress Notes (Signed)
    Pamela BirchwoodKrysten Montoya 07/18/1995 [id number]        22 y.o.  G0P0 presents for ultrasound.  History of dysmenorrhea.  Past medical history,surgical history, problem list, medications, allergies, family history and social history were all reviewed and documented in the EPIC chart.  Directed ROS with pertinent positives and negatives documented in the history of present illness/assessment and plan.  Exam: Vitals:   06/16/18 1157  BP: 118/72   General appearance:  Normal  Ultrasound transvaginal shows uterus normal size and echotexture.  Endometrial echo 11 mm, tri layered.  Right and left ovaries normal.  Classic appearing corpus luteal cyst on left ovary measuring 2518 mm.  Cul-de-sac negative  Assessment/Plan:  [age] y.o. G0P0 with normal GYN ultrasound.  Reviewed with patient the ultrasound findings.  Does not rule out more subtle endometriosis but no gross evidence of endometriomas or other pathology.  Options for management to include expectant versus hormonal suppression discussed.  She does not want to try birth control pills as she had a bad experience with nausea and vomiting.  We did discuss possibly trying the low-dose LoLoestrin 1/10 but she is not interested at this point.  Alternatives to include trial of NuvaRing as well as Depo-Provera, Nexplanon and Mirena IUD.  The pros and cons of each choice discussed.  The patient wants to think of her options and to research more on her own.  We did give her a sample of NuvaRing to take home with her in the event that she would like to try this.  How to use this was discussed with her and the potential side effects to include nausea and vomiting despite transvaginal absorption as well as thrombosis risks.  Patient will follow-up with her ultimate decision.    Dara Lordsimothy P Fontaine MD, 12:10 PM 06/16/2018

## 2018-08-27 ENCOUNTER — Ambulatory Visit: Payer: BLUE CROSS/BLUE SHIELD | Admitting: Family Medicine

## 2019-01-14 ENCOUNTER — Other Ambulatory Visit: Payer: Self-pay | Admitting: Physician Assistant

## 2019-03-23 ENCOUNTER — Encounter: Payer: Self-pay | Admitting: Gynecology

## 2019-06-23 ENCOUNTER — Other Ambulatory Visit: Payer: Self-pay | Admitting: Physician Assistant

## 2019-06-23 ENCOUNTER — Other Ambulatory Visit: Payer: Self-pay | Admitting: Family Medicine

## 2019-06-23 NOTE — Telephone Encounter (Signed)
celecoxib (CELEBREX) 200 MG capsule   Patient requesting refill.    Pharmacy:  [facility] [place], [place] [facility] [place] Phone:  [phone]  Fax:  [phone]

## 2019-07-13 ENCOUNTER — Telehealth (INDEPENDENT_AMBULATORY_CARE_PROVIDER_SITE_OTHER): Payer: 59 | Admitting: Registered Nurse

## 2019-07-13 ENCOUNTER — Other Ambulatory Visit: Payer: Self-pay

## 2019-07-13 ENCOUNTER — Encounter: Payer: Self-pay | Admitting: Registered Nurse

## 2019-07-13 VITALS — Temp 99.0°F | Ht 60.0 in | Wt 175.0 lb

## 2019-07-13 DIAGNOSIS — J111 Influenza due to unidentified influenza virus with other respiratory manifestations: Secondary | ICD-10-CM | POA: Diagnosis not present

## 2019-07-13 MED ORDER — OSELTAMIVIR PHOSPHATE 75 MG PO CAPS: 75.0000 mg | ORAL_CAPSULE | Freq: Two times a day (BID) | ORAL | 0 refills | Status: DC

## 2019-07-14 ENCOUNTER — Other Ambulatory Visit: Payer: Self-pay

## 2019-07-14 ENCOUNTER — Ambulatory Visit (HOSPITAL_COMMUNITY)
Admission: EM | Admit: 2019-07-14 | Discharge: 2019-07-14 | Disposition: A | Discharge status: Home / Self Care | Payer: 59 | Attending: Urgent Care | Admitting: Urgent Care

## 2019-07-14 ENCOUNTER — Encounter (HOSPITAL_COMMUNITY): Payer: Self-pay | Admitting: Emergency Medicine

## 2019-07-14 DIAGNOSIS — R509 Fever, unspecified: Secondary | ICD-10-CM | POA: Insufficient documentation

## 2019-07-14 DIAGNOSIS — K589 Irritable bowel syndrome without diarrhea: Secondary | ICD-10-CM | POA: Diagnosis not present

## 2019-07-14 DIAGNOSIS — Z79899 Other long term (current) drug therapy: Secondary | ICD-10-CM | POA: Insufficient documentation

## 2019-07-14 DIAGNOSIS — R110 Nausea: Secondary | ICD-10-CM | POA: Diagnosis not present

## 2019-07-14 DIAGNOSIS — R040 Epistaxis: Secondary | ICD-10-CM | POA: Diagnosis not present

## 2019-07-14 DIAGNOSIS — R748 Abnormal levels of other serum enzymes: Secondary | ICD-10-CM | POA: Insufficient documentation

## 2019-07-14 DIAGNOSIS — J45909 Unspecified asthma, uncomplicated: Secondary | ICD-10-CM | POA: Diagnosis not present

## 2019-07-14 DIAGNOSIS — R000 Tachycardia, unspecified: Secondary | ICD-10-CM | POA: Insufficient documentation

## 2019-07-14 DIAGNOSIS — R42 Dizziness and giddiness: Secondary | ICD-10-CM | POA: Insufficient documentation

## 2019-07-14 DIAGNOSIS — Z791 Long term (current) use of non-steroidal anti-inflammatories (NSAID): Secondary | ICD-10-CM | POA: Insufficient documentation

## 2019-07-14 DIAGNOSIS — R5381 Other malaise: Secondary | ICD-10-CM | POA: Insufficient documentation

## 2019-07-14 DIAGNOSIS — Z20822 Contact with and (suspected) exposure to covid-19: Secondary | ICD-10-CM | POA: Diagnosis not present

## 2019-07-14 DIAGNOSIS — B349 Viral infection, unspecified: Secondary | ICD-10-CM | POA: Insufficient documentation

## 2019-07-14 DIAGNOSIS — R5383 Other fatigue: Secondary | ICD-10-CM | POA: Insufficient documentation

## 2019-07-14 DIAGNOSIS — M791 Myalgia, unspecified site: Secondary | ICD-10-CM | POA: Insufficient documentation

## 2019-07-14 DIAGNOSIS — N920 Excessive and frequent menstruation with regular cycle: Secondary | ICD-10-CM

## 2019-07-14 HISTORY — DX: Irritable bowel syndrome, unspecified: K58.9

## 2019-07-14 LAB — COMPREHENSIVE METABOLIC PANEL
ALT: 178 U/L — ABNORMAL HIGH (ref 0–44)
AST: 162 U/L — ABNORMAL HIGH (ref 15–41)
Albumin: 3.9 g/dL (ref 3.5–5.0)
Alkaline Phosphatase: 178 U/L — ABNORMAL HIGH (ref 38–126)
Anion gap: 8 (ref 5–15)
BUN: 5 mg/dL — ABNORMAL LOW (ref 6–20)
CO2: 24 mmol/L (ref 22–32)
Calcium: 9.3 mg/dL (ref 8.9–10.3)
Chloride: 103 mmol/L (ref 98–111)
Creatinine, Ser: 0.71 mg/dL (ref 0.44–1.00)
GFR calc Af Amer: >60 mL/min (ref >60)
GFR calc non Af Amer: >60 mL/min (ref >60)
Glucose, Bld: 97 mg/dL (ref 70–99)
Potassium: 3.8 mmol/L (ref 3.5–5.1)
Sodium: 135 mmol/L (ref 135–145)
Total Bilirubin: 0.6 mg/dL (ref 0.3–1.2)
Total Protein: 7.2 g/dL (ref 6.5–8.1)

## 2019-07-14 LAB — POC SARS CORONAVIRUS 2 AG: SARS Coronavirus 2 Ag: NEGATIVE

## 2019-07-14 LAB — POCT URINALYSIS DIP (DEVICE)
Bilirubin Urine: NEGATIVE
Glucose, UA: NEGATIVE mg/dL
Ketones, ur: NEGATIVE mg/dL
Leukocytes,Ua: NEGATIVE
Nitrite: NEGATIVE
Protein, ur: 30 mg/dL — AB
Specific Gravity, Urine: 1.01 (ref 1.005–1.030)
Urobilinogen, UA: 0.2 mg/dL (ref 0.0–1.0)
pH: 7 (ref 5.0–8.0)

## 2019-07-14 LAB — CBC WITH DIFFERENTIAL/PLATELET
Abs Immature Granulocytes: 0 10*3/uL (ref 0.00–0.07)
Basophils Absolute: 0 10*3/uL (ref 0.0–0.1)
Basophils Relative: 1 %
Eosinophils Absolute: 0 10*3/uL (ref 0.0–0.5)
Eosinophils Relative: 0 %
HCT: 36.3 % (ref 36.0–46.0)
Hemoglobin: 12.1 g/dL (ref 12.0–15.0)
Lymphocytes Relative: 43 %
Lymphs Abs: 1.9 10*3/uL (ref 0.7–4.0)
MCH: 28.3 pg (ref 26.0–34.0)
MCHC: 33.3 g/dL (ref 30.0–36.0)
MCV: 85 fL (ref 80.0–100.0)
Monocytes Absolute: 0.4 10*3/uL (ref 0.1–1.0)
Monocytes Relative: 8 %
Myelocytes: 1 %
Neutro Abs: 2.1 10*3/uL (ref 1.7–7.7)
Neutrophils Relative %: 47 %
Platelets: 175 10*3/uL (ref 150–400)
RBC: 4.27 MIL/uL (ref 3.87–5.11)
RDW: 13.9 % (ref 11.5–15.5)
WBC: 4.4 10*3/uL (ref 4.0–10.5)
nRBC: 0 % (ref 0.0–0.2)
nRBC: 0 /100 WBC

## 2019-07-14 LAB — POC SARS CORONAVIRUS 2 AG -  ED: SARS Coronavirus 2 Ag: NEGATIVE

## 2019-07-14 MED ORDER — ONDANSETRON 8 MG PO TBDP: 8.0000 mg | ORAL_TABLET | Freq: Three times a day (TID) | ORAL | 0 refills | Status: DC | PRN

## 2019-07-14 MED ORDER — SODIUM CHLORIDE 0.9 % IV BOLUS
1000.0000 mL | Freq: Once | INTRAVENOUS | Status: AC
  Administered 2019-07-14: 1000 mL via INTRAVENOUS

## 2019-07-14 NOTE — ED Provider Notes (Signed)
[place]   MRN: [id number] DOB: [birth year]  Subjective:   Pamela Montoya is a 24 y.o. female presenting for 5-day history acute onset of persistent and worsening moderate to severe malaise including fevers, body aches, feeling woozy, nausea.  Patient was being managed by her PCP for influenza, no testing was done.  She did test negative for COVID-19, received results on July 12, 2019.  States that she has been pushing fluids at home as advised by her PCP.  Has also done general supportive care.  Today, patient states that she felt worse and had a severe nosebleed.  She states that she believes she lost a lot of blood as it persisted and lasted for a couple of hours.  Denies history of chronic nosebleeds.  Also reports that she had her cycle this past week and states that it was much heavier than it is normally.  Denies chest pain, shortness of breath, abdominal pain.  No current facility-administered medications for this encounter.  Current Outpatient Medications:  .  albuterol (PROVENTIL HFA;VENTOLIN HFA) 108 (90 Base) MCG/ACT inhaler, Inhale into the lungs every 6 (six) hours as needed for wheezing or shortness of breath., Disp: , Rfl:  .  celecoxib (CELEBREX) 200 MG capsule, Take 1 capsule (200 mg total) by mouth daily. TAKE 2 CAPSULES BY MOUTH ON DAY 1 OF MENSES(MAY TAKE 1 ADDITIONAL ON DAY 1 IF NEEDED); THEN TAKE 1 CAPSULE TWICE DAILY FOR REMAINDER OF CYCLE, Disp: 30 capsule, Rfl: 0 .  Phenylephrine-Pheniramine-DM (THERAFLU COLD & COUGH PO), Take by mouth., Disp: , Rfl:  .  ibuprofen (ADVIL,MOTRIN) 800 MG tablet, Take 1 tablet (800 mg total) by mouth every 8 (eight) hours as needed., Disp: 60 tablet, Rfl: 1 .  ondansetron (ZOFRAN) 4 MG tablet, Take 1 tablet (4 mg total) by mouth every 8 (eight) hours as needed for nausea or vomiting., Disp: 20 tablet, Rfl: 0 .  oseltamivir (TAMIFLU) 75 MG capsule, Take 1 capsule (75 mg total) by mouth 2 (two) times daily., Disp: 10 capsule, Rfl:  0   No Known Allergies  Past Medical History:  Diagnosis Date  . Asthma   . IBS (irritable bowel syndrome)   . Pseudotumor cerebri 2007     Past Surgical History:  Procedure Laterality Date  . Spinal tap      Family History  Problem Relation Age of Onset  . Hypertension Father   . Heart defect Sister     Social History   Tobacco Use  . Smoking status: Never Smoker  . Smokeless tobacco: Never Used  Substance Use Topics  . Alcohol use: Yes    Comment: occ  . Drug use: Never    Review of Systems  Constitutional: Positive for malaise/fatigue. Negative for fever.  HENT: Positive for nosebleeds. Negative for congestion, ear pain, sinus pain and sore throat.   Eyes: Negative for discharge and redness.  Respiratory: Negative for cough, hemoptysis, shortness of breath and wheezing.   Cardiovascular: Negative for chest pain.  Gastrointestinal: Positive for nausea. Negative for abdominal pain, diarrhea and vomiting.  Genitourinary: Negative for dysuria, flank pain and hematuria.  Musculoskeletal: Positive for myalgias.  Skin: Negative for rash.  Neurological: Positive for dizziness. Negative for weakness and headaches.  Psychiatric/Behavioral: Negative for depression and substance abuse.     Objective:   Vitals: BP 97/66 (BP Location: Right Arm)   Pulse (!) 124   Temp 98.9 F (37.2 C) (Oral)   Resp 18   LMP 07/10/2019   SpO2  97%   Wt Readings from Last 3 Encounters:  07/13/19 175 lb (79.4 kg)  05/16/18 171 lb (77.6 kg)  05/06/18 170 lb 9.6 oz (77.4 kg)   Temp Readings from Last 3 Encounters:  07/14/19 98.9 F (37.2 C) (Oral)  07/13/19 99 F (37.2 C) (Oral)  05/06/18 98.5 F (36.9 C) (Oral)   BP Readings from Last 3 Encounters:  07/14/19 97/66  06/16/18 118/72  05/16/18 118/68   Pulse Readings from Last 3 Encounters:  07/14/19 (!) 124  05/06/18 86  01/28/18 88   Pulse is 105 on recheck.   Physical Exam Constitutional:      General: She is not  in acute distress.    Appearance: Normal appearance. She is well-developed and normal weight. She is not ill-appearing, toxic-appearing or diaphoretic.  HENT:     Head: Normocephalic and atraumatic.     Right Ear: External ear normal.     Left Ear: External ear normal.     Nose: Nose normal.     Mouth/Throat:     Mouth: Mucous membranes are moist.     Pharynx: Oropharynx is clear.  Eyes:     General: No scleral icterus.    Extraocular Movements: Extraocular movements intact.     Pupils: Pupils are equal, round, and reactive to light.  Cardiovascular:     Rate and Rhythm: Regular rhythm. Tachycardia present.     Pulses: Normal pulses.     Heart sounds: Normal heart sounds. No murmur. No friction rub. No gallop.   Pulmonary:     Effort: Pulmonary effort is normal. No respiratory distress.     Breath sounds: Normal breath sounds. No stridor. No wheezing, rhonchi or rales.  Abdominal:     General: Bowel sounds are normal. There is no distension.     Palpations: Abdomen is soft. There is no mass.     Tenderness: There is no abdominal tenderness. There is no right CVA tenderness, left CVA tenderness, guarding or rebound.  Skin:    General: Skin is warm and dry.     Coloration: Skin is not pale.     Findings: No rash.  Neurological:     General: No focal deficit present.     Mental Status: She is alert and oriented to person, place, and time.     Cranial Nerves: No cranial nerve deficit.     Motor: No weakness.     Coordination: Coordination normal.     Gait: Gait normal.     Deep Tendon Reflexes: Reflexes normal.  Psychiatric:        Mood and Affect: Mood normal.        Behavior: Behavior normal.        Thought Content: Thought content normal.        Judgment: Judgment normal.    Results for orders placed or performed during the hospital encounter of 07/14/19 (from the past 24 hour(s))  CBC with Differential     Status: None   Collection Time: 07/14/19 10:20 AM  Result Value  Ref Range   WBC 4.4 4.0 - 10.5 K/uL   RBC 4.27 3.87 - 5.11 MIL/uL   Hemoglobin 12.1 12.0 - 15.0 g/dL   HCT 36.3 36.0 - 46.0 %   MCV 85.0 80.0 - 100.0 fL   MCH 28.3 26.0 - 34.0 pg   MCHC 33.3 30.0 - 36.0 g/dL   RDW 13.9 11.5 - 15.5 %   Platelets 175 150 - 400 K/uL   nRBC 0.0  0.0 - 0.2 %   Neutrophils Relative % 47 %   Neutro Abs 2.1 1.7 - 7.7 K/uL   Lymphocytes Relative 43 %   Lymphs Abs 1.9 0.7 - 4.0 K/uL   Monocytes Relative 8 %   Monocytes Absolute 0.4 0.1 - 1.0 K/uL   Eosinophils Relative 0 %   Eosinophils Absolute 0.0 0.0 - 0.5 K/uL   Basophils Relative 1 %   Basophils Absolute 0.0 0.0 - 0.1 K/uL   nRBC 0 0 /100 WBC   Myelocytes 1 %   Abs Immature Granulocytes 0.00 0.00 - 0.07 K/uL  Comprehensive metabolic panel     Status: Abnormal   Collection Time: 07/14/19 10:20 AM  Result Value Ref Range   Sodium 135 135 - 145 mmol/L   Potassium 3.8 3.5 - 5.1 mmol/L   Chloride 103 98 - 111 mmol/L   CO2 24 22 - 32 mmol/L   Glucose, Bld 97 70 - 99 mg/dL   BUN 5 (L) 6 - 20 mg/dL   Creatinine, Ser 0.71 0.44 - 1.00 mg/dL   Calcium 9.3 8.9 - 10.3 mg/dL   Total Protein 7.2 6.5 - 8.1 g/dL   Albumin 3.9 3.5 - 5.0 g/dL   AST 162 (H) 15 - 41 U/L   ALT 178 (H) 0 - 44 U/L   Alkaline Phosphatase 178 (H) 38 - 126 U/L   Total Bilirubin 0.6 0.3 - 1.2 mg/dL   GFR calc non Af Amer >60 >60 mL/min   GFR calc Af Amer >60 >60 mL/min   Anion gap 8 5 - 15  POC SARS Coronavirus 2 Ag-ED - Nasal Swab (BD Veritor Kit)     Status: None   Collection Time: 07/14/19 10:35 AM  Result Value Ref Range   SARS Coronavirus 2 Ag NEGATIVE NEGATIVE  POC SARS Coronavirus 2 Ag     Status: None   Collection Time: 07/14/19 10:35 AM  Result Value Ref Range   SARS Coronavirus 2 Ag NEGATIVE NEGATIVE  POCT urinalysis dip (device)     Status: Abnormal   Collection Time: 07/14/19 11:56 AM  Result Value Ref Range   Glucose, UA NEGATIVE NEGATIVE mg/dL   Bilirubin Urine NEGATIVE NEGATIVE   Ketones, ur NEGATIVE NEGATIVE  mg/dL   Specific Gravity, Urine 1.010 1.005 - 1.030   Hgb urine dipstick LARGE (A) NEGATIVE   pH 7.0 5.0 - 8.0   Protein, ur 30 (A) NEGATIVE mg/dL   Urobilinogen, UA 0.2 0.0 - 1.0 mg/dL   Nitrite NEGATIVE NEGATIVE   Leukocytes,Ua NEGATIVE NEGATIVE   ED ECG REPORT   Date: 07/14/2019  Rate: 105bpm  Rhythm: sinus tachycardia  QRS Axis: normal  Intervals: normal  ST/T Wave abnormalities: normal  Conduction Disutrbances:none  Narrative Interpretation: Sinus tachycardia at 105bpm, no ecg for comparison.   Old EKG Reviewed: none available  I have personally reviewed the EKG tracing and agree with the computerized printout as noted.  Patient given 1 L of normal saline IV fluids.  Assessment and Plan :   1. Viral syndrome   2. Epistaxis   3. Tachycardia   4. Myalgia   5. Malaise and fatigue   6. Elevated liver enzymes     Patient had elevated liver enzymes, admits that she has been using a lot of Tylenol lately.  At this time, her blood pressure and tachycardia have improved with IV fluids.  EKG is reassuring, CBC is also reassuring.  She does not have an electrolyte disturbance and renal function is  normal.  Counseled patient that I suspect she is undergoing a undifferentiated viral syndrome, COVID-19 confirmation testing pending.  Physical exam findings stable for discharge.  Maintain close follow-up with PCP, strict ER precautions. Counseled patient on potential for adverse effects with medications prescribed/recommended today, ER and return-to-clinic precautions discussed, patient verbalized understanding.    Jaynee Eagles, PA-C 07/14/19 1250

## 2019-07-14 NOTE — Progress Notes (Signed)
Telemedicine Encounter- SOAP NOTE Established Patient  This telephone encounter was conducted with the patient's (or proxy's) verbal consent via audio telecommunications: yes  Patient was instructed to have this encounter in a suitably private space; and to only have persons present to whom they give permission to participate. In addition, patient identity was confirmed by use of name plus two identifiers (DOB and address).  I discussed the limitations, risks, security and privacy concerns of performing an evaluation and management service by telephone and the availability of in person appointments. I also discussed with the patient that there may be a patient responsible charge related to this service. The patient expressed understanding and agreed to proceed.  I spent a total of 12 minutes talking with the patient or their proxy.  Chief Complaint  Patient presents with  . Fatigue    fatigue, bodyache, and fever for x5 days. Was covid tetsed 05/09/20 which was negative.    Subjective   Pamela Montoya is a 24 y.o. established patient. Telephone visit today for flu like symptoms.  HPI Pt reports symptoms have occurred for five days. Was COVID tested on Jan 8 - negative. Body aches, fatigues, and fever. Has been out from work. No known sick contacts. No GI symptoms. No known sick contacts. Mild respiratory symptoms. Has been trying supportive care.   There are no problems to display for this patient.   Past Medical History:  Diagnosis Date  . Asthma   . Pseudotumor cerebri 2007    Current Outpatient Medications  Medication Sig Dispense Refill  . albuterol (PROVENTIL HFA;VENTOLIN HFA) 108 (90 Base) MCG/ACT inhaler Inhale into the lungs every 6 (six) hours as needed for wheezing or shortness of breath.    . celecoxib (CELEBREX) 200 MG capsule Take 1 capsule (200 mg total) by mouth daily. TAKE 2 CAPSULES BY MOUTH ON DAY 1 OF MENSES(MAY TAKE 1 ADDITIONAL ON DAY 1 IF NEEDED); THEN  TAKE 1 CAPSULE TWICE DAILY FOR REMAINDER OF CYCLE 30 capsule 0  . ibuprofen (ADVIL,MOTRIN) 800 MG tablet Take 1 tablet (800 mg total) by mouth every 8 (eight) hours as needed. 60 tablet 1  . ondansetron (ZOFRAN) 4 MG tablet Take 1 tablet (4 mg total) by mouth every 8 (eight) hours as needed for nausea or vomiting. 20 tablet 0  . oseltamivir (TAMIFLU) 75 MG capsule Take 1 capsule (75 mg total) by mouth 2 (two) times daily. 10 capsule 0   No current facility-administered medications for this visit.    No Known Allergies  Social History   Socioeconomic History  . Marital status: Single    Spouse name: Not on file  . Number of children: 0  . Years of education: Not on file  . Highest education level: Not on file  Occupational History  . Not on file  Tobacco Use  . Smoking status: Never Smoker  . Smokeless tobacco: Never Used  Substance and Sexual Activity  . Alcohol use: Yes    Comment: occ  . Drug use: Not on file  . Sexual activity: Yes    Birth control/protection: Condom    Comment: 1st intercourse 24 yo-More than 5 partners  Other Topics Concern  . Not on file  Social History Narrative  . Not on file   Social Determinants of Health   Financial Resource Strain:   . Difficulty of Paying Living Expenses: Not on file  Food Insecurity:   . Worried About Charity fundraiser in the Last Year: Not  on file  . Ran Out of Food in the Last Year: Not on file  Transportation Needs:   . Lack of Transportation (Medical): Not on file  . Lack of Transportation (Non-Medical): Not on file  Physical Activity:   . Days of Exercise per Week: Not on file  . Minutes of Exercise per Session: Not on file  Stress:   . Feeling of Stress : Not on file  Social Connections:   . Frequency of Communication with Friends and Family: Not on file  . Frequency of Social Gatherings with Friends and Family: Not on file  . Attends Religious Services: Not on file  . Active Member of Clubs or  Organizations: Not on file  . Attends Banker Meetings: Not on file  . Marital Status: Not on file  Intimate Partner Violence:   . Fear of Current or Ex-Partner: Not on file  . Emotionally Abused: Not on file  . Physically Abused: Not on file  . Sexually Abused: Not on file    ROS Per hpi   Objective   Vitals as reported by the patient: Today's Vitals   07/13/19 1029  Temp: 99 F (37.2 C)  TempSrc: Oral  SpO2: 99%  Weight: 175 lb (79.4 kg)  Height: 5' (1.524 m)    Pamela Montoya was seen today for fatigue.  Diagnoses and all orders for this visit:  Influenza -     oseltamivir (TAMIFLU) 75 MG capsule; Take 1 capsule (75 mg total) by mouth 2 (two) times daily.   PLAN  Discussed tamiflu and its role as antiviral  Otherwise, pt will keep performing supportive care and isolating.  Pt demonstrates good understanding of this care and reasons to seek further care.  Patient encouraged to call clinic with any questions, comments, or concerns.   I discussed the assessment and treatment plan with the patient. The patient was provided an opportunity to ask questions and all were answered. The patient agreed with the plan and demonstrated an understanding of the instructions.   The patient was advised to call back or seek an in-person evaluation if the symptoms worsen or if the condition fails to improve as anticipated.  I provided 12 minutes of non-face-to-face time during this encounter.  Janeece Agee, NP  Primary Care at [facility]

## 2019-07-14 NOTE — ED Triage Notes (Signed)
Nosebleed started at 8:30 am today.    Patient reports her pcp is treating her for flu-like symptoms.  Patient states she tested negative for covid on  07/10/2019, received results on 07/12/2019

## 2019-07-14 NOTE — Discharge Instructions (Addendum)
We will manage this as a viral syndrome. For sore throat or cough try using a honey-based tea. Use 3 teaspoons of honey with juice squeezed from half lemon. Place shaved pieces of ginger into 1/2-1 cup of water and warm over stove top. Then mix the ingredients and repeat every 4 hours as needed. Please take ibuprofen 600mg -800mg  every 8 hours as needed. Hydrate very well with at least 2 liters of water. Eat light meals such as soups to replenish electrolytes and soft fruits, veggies. Start an antihistamine like Zyrtec (cetirizine) at 10mg  daily for postnasal drainage, sinus congestion.

## 2019-07-16 LAB — NOVEL CORONAVIRUS, NAA (HOSP ORDER, SEND-OUT TO REF LAB; TAT 18-24 HRS): SARS-CoV-2, NAA: NOT DETECTED

## 2019-07-22 ENCOUNTER — Ambulatory Visit (INDEPENDENT_AMBULATORY_CARE_PROVIDER_SITE_OTHER): Payer: 59 | Admitting: Registered Nurse

## 2019-07-22 ENCOUNTER — Encounter: Payer: Self-pay | Admitting: Registered Nurse

## 2019-07-22 ENCOUNTER — Other Ambulatory Visit: Payer: Self-pay

## 2019-07-22 VITALS — BP 106/66 | HR 100 | Temp 97.7°F | Ht 60.0 in | Wt 177.4 lb

## 2019-07-22 DIAGNOSIS — R748 Abnormal levels of other serum enzymes: Secondary | ICD-10-CM | POA: Diagnosis not present

## 2019-07-22 DIAGNOSIS — Z130 Encounter for screening for diseases of the blood and blood-forming organs and certain disorders involving the immune mechanism: Secondary | ICD-10-CM

## 2019-07-22 DIAGNOSIS — N946 Dysmenorrhea, unspecified: Secondary | ICD-10-CM | POA: Diagnosis not present

## 2019-07-22 DIAGNOSIS — Z13228 Encounter for screening for other metabolic disorders: Secondary | ICD-10-CM

## 2019-07-22 DIAGNOSIS — Z13220 Encounter for screening for lipoid disorders: Secondary | ICD-10-CM

## 2019-07-22 DIAGNOSIS — Z1329 Encounter for screening for other suspected endocrine disorder: Secondary | ICD-10-CM | POA: Diagnosis not present

## 2019-07-22 MED ORDER — CELECOXIB 200 MG PO CAPS: 200.0000 mg | ORAL_CAPSULE | Freq: Every day | ORAL | 0 refills | Status: DC

## 2019-07-22 NOTE — Progress Notes (Signed)
Established Patient Office Visit  Subjective:  Patient ID: Pamela Montoya, female    DOB: 15-May-1996  Age: 24 y.o. MRN: [id number]  CC:  Chief Complaint  Patient presents with  . Transitions Of Care    establish care , also have a lump behind her left ear for about 3 days.    HPI Jovan Schickling presents for visit to establish care.   Two concerns today:   Mass behind left ear. Onset three days ago. Not changing. No discoloration of skin. No drainage or discharge. Has not happened before. Not painful. No changes to hearing or other effect on ear.   Irregular menses: missed menses in December, heavy menses in January. Normal duration but heavy bleeding, heavier cramping. No clotting. No other sxs. Has not happened before. Reports around menarche had some irregularities but more regular over the years.  Otherwise feeling good. Much improved since URI symptoms last week. Some fevers in the evening managed with acetaminophen. At an in person visit last week, notably elevated LFTs noted. Will repeat. Otherwise had a normal CBC at that time.  Past Medical History:  Diagnosis Date  . Asthma   . IBS (irritable bowel syndrome)   . Pseudotumor cerebri 2007    Past Surgical History:  Procedure Laterality Date  . Spinal tap    . WISDOM TOOTH EXTRACTION      Family History  Problem Relation Age of Onset  . Hypertension Father   . Lung cancer Maternal Grandmother   . Heart attack Maternal Grandfather   . Dementia Maternal Grandfather   . Colon cancer Paternal Grandmother   . Heart defect Sister     Social History   Socioeconomic History  . Marital status: Significant Other    Spouse name: Not on file  . Number of children: 0  . Years of education: Not on file  . Highest education level: Not on file  Occupational History  . Not on file  Tobacco Use  . Smoking status: Never Smoker  . Smokeless tobacco: Never Used  Substance and Sexual Activity  . Alcohol use: Yes   Comment: occ  . Drug use: Never  . Sexual activity: Yes    Birth control/protection: Condom    Comment: 1st intercourse 24 yo-More than 5 partners  Other Topics Concern  . Not on file  Social History Narrative  . Not on file   Social Determinants of Health   Financial Resource Strain:   . Difficulty of Paying Living Expenses: Not on file  Food Insecurity:   . Worried About Charity fundraiser in the Last Year: Not on file  . Ran Out of Food in the Last Year: Not on file  Transportation Needs:   . Lack of Transportation (Medical): Not on file  . Lack of Transportation (Non-Medical): Not on file  Physical Activity:   . Days of Exercise per Week: Not on file  . Minutes of Exercise per Session: Not on file  Stress:   . Feeling of Stress : Not on file  Social Connections:   . Frequency of Communication with Friends and Family: Not on file  . Frequency of Social Gatherings with Friends and Family: Not on file  . Attends Religious Services: Not on file  . Active Member of Clubs or Organizations: Not on file  . Attends Archivist Meetings: Not on file  . Marital Status: Not on file  Intimate Partner Violence:   . Fear of Current or Ex-Partner: Not  on file  . Emotionally Abused: Not on file  . Physically Abused: Not on file  . Sexually Abused: Not on file    Outpatient Medications Prior to Visit  Medication Sig Dispense Refill  . albuterol (PROVENTIL HFA;VENTOLIN HFA) 108 (90 Base) MCG/ACT inhaler Inhale into the lungs every 6 (six) hours as needed for wheezing or shortness of breath.    Marland Kitchen ibuprofen (ADVIL,MOTRIN) 800 MG tablet Take 1 tablet (800 mg total) by mouth every 8 (eight) hours as needed. 60 tablet 1  . ondansetron (ZOFRAN) 4 MG tablet Take 1 tablet (4 mg total) by mouth every 8 (eight) hours as needed for nausea or vomiting. 20 tablet 0  . ondansetron (ZOFRAN-ODT) 8 MG disintegrating tablet Take 1 tablet (8 mg total) by mouth every 8 (eight) hours as needed for  nausea or vomiting. 30 tablet 0  . celecoxib (CELEBREX) 200 MG capsule Take 1 capsule (200 mg total) by mouth daily. TAKE 2 CAPSULES BY MOUTH ON DAY 1 OF MENSES(MAY TAKE 1 ADDITIONAL ON DAY 1 IF NEEDED); THEN TAKE 1 CAPSULE TWICE DAILY FOR REMAINDER OF CYCLE 30 capsule 0  . oseltamivir (TAMIFLU) 75 MG capsule Take 1 capsule (75 mg total) by mouth 2 (two) times daily. (Patient not taking: Reported on 07/22/2019) 10 capsule 0  . Phenylephrine-Pheniramine-DM (THERAFLU COLD & COUGH PO) Take by mouth.     No facility-administered medications prior to visit.    No Known Allergies  ROS Review of Systems  Constitutional: Negative.   HENT: Negative.   Eyes: Negative.   Respiratory: Negative.   Cardiovascular: Negative.   Gastrointestinal: Negative.   Endocrine: Negative.   Genitourinary: Negative.   Musculoskeletal: Negative.   Skin: Negative.   Allergic/Immunologic: Negative.   Neurological: Negative.   Hematological: Negative.   Psychiatric/Behavioral: Negative.   All other systems reviewed and are negative.     Objective:    Physical Exam  Constitutional: She is oriented to person, place, and time. She appears well-developed and well-nourished. No distress.  HENT:  Head: Normocephalic and atraumatic.  Right Ear: External ear normal.  Left Ear: External ear normal.  Neck: No thyromegaly present.  Cardiovascular: Normal rate, regular rhythm, normal heart sounds and intact distal pulses. Exam reveals no gallop and no friction rub.  No murmur heard. Pulmonary/Chest: Effort normal and breath sounds normal. No respiratory distress. She has no wheezes. She has no rales. She exhibits no tenderness.  Musculoskeletal:     Cervical back: Normal range of motion.  Lymphadenopathy:       Head (left side): Posterior auricular (mildly swollen, firm, nontender, mobile. ) adenopathy present.  Neurological: She is alert and oriented to person, place, and time.  Skin: Skin is warm and dry. No  rash noted. She is not diaphoretic. No erythema. No pallor.  Psychiatric: She has a normal mood and affect. Her behavior is normal. Judgment and thought content normal.  Nursing note and vitals reviewed.   BP 106/66   Pulse 100   Temp 97.7 F (36.5 C) (Temporal)   Ht 5' (1.524 m)   Wt 177 lb 6.4 oz (80.5 kg)   LMP 07/13/2019   SpO2 96%   BMI 34.65 kg/m  Wt Readings from Last 3 Encounters:  07/22/19 177 lb 6.4 oz (80.5 kg)  07/13/19 175 lb (79.4 kg)  05/16/18 171 lb (77.6 kg)     Health Maintenance Due  Topic Date Due  . HIV Screening  01/06/2011  . PAP-Cervical Cytology Screening  01/05/2017  .  PAP SMEAR-Modifier  01/05/2017  . INFLUENZA VACCINE  01/31/2019    There are no preventive care reminders to display for this patient.  No results found for: TSH Lab Results  Component Value Date   WBC 4.4 07/14/2019   HGB 12.1 07/14/2019   HCT 36.3 07/14/2019   MCV 85.0 07/14/2019   PLT 175 07/14/2019   Lab Results  Component Value Date   NA 135 07/14/2019   K 3.8 07/14/2019   CO2 24 07/14/2019   GLUCOSE 97 07/14/2019   BUN 5 (L) 07/14/2019   CREATININE 0.71 07/14/2019   BILITOT 0.6 07/14/2019   ALKPHOS 178 (H) 07/14/2019   AST 162 (H) 07/14/2019   ALT 178 (H) 07/14/2019   PROT 7.2 07/14/2019   ALBUMIN 3.9 07/14/2019   CALCIUM 9.3 07/14/2019   ANIONGAP 8 07/14/2019   No results found for: CHOL No results found for: HDL No results found for: LDLCALC No results found for: TRIG No results found for: CHOLHDL No results found for: DGLO7F    Assessment & Plan:   Problem List Items Addressed This Visit    None    Visit Diagnoses    Elevated liver enzymes    -  Primary   Relevant Orders   Hepatic Function Panel   Screening for endocrine, metabolic and immunity disorder       Relevant Orders   TSH   Lipid screening       Relevant Orders   Lipid Panel   Menstrual cramps       Relevant Medications   celecoxib (CELEBREX) 200 MG capsule      Meds  ordered this encounter  Medications  . celecoxib (CELEBREX) 200 MG capsule    Sig: Take 1 capsule (200 mg total) by mouth daily. TAKE 2 CAPSULES BY MOUTH ON DAY 1 OF MENSES(MAY TAKE 1 ADDITIONAL ON DAY 1 IF NEEDED); THEN TAKE 1 CAPSULE TWICE DAILY FOR REMAINDER OF CYCLE    Dispense:  30 capsule    Refill:  0    Order Specific Question:   Supervising Provider    Answer:   Doristine Bosworth [id number]    Follow-up: No follow-ups on file.   PLAN  Swelling of posterior auricular lymph node on L side. Likely related to recent infection. I suspect this will resolve in the coming days to weeks. If it does not resolve or if it worsens, she should return to clinic for further workup  Pt has tried COCs in the past but did not like side effects - does not want to try them again. She will monitor her menses, if irregularities continue, will consider BCM to help control this or consider referral to Gyn for management.  History of pseudotumor on optic nerve - no changes for some time. Discharged by Kateri Mc, now follows with ophthalmology regularly.  TSH, lipids, hepatic function panel drawn. Will follow up as warranted. If LFTs continue to be elevated, will recheck again or consider imaging, further labs, or specialist referral.  Otherwise no concerns at this time Janeece Agee, NP

## 2019-07-22 NOTE — Patient Instructions (Signed)
° ° ° °  If you have lab work done today you will be contacted with your lab results within the next 2 weeks.  If you have not heard from us then please contact us. The fastest way to get your results is to register for My Chart. ° ° °IF you received an x-ray today, you will receive an invoice from [facility]. Please contact [facility] at [phone] with questions or concerns regarding your invoice.  ° °IF you received labwork today, you will receive an invoice from [facility]. Please contact [facility] at [phone] with questions or concerns regarding your invoice.  ° °Our billing staff will not be able to assist you with questions regarding bills from these companies. ° °You will be contacted with the lab results as soon as they are available. The fastest way to get your results is to activate your My Chart account. Instructions are located on the last page of this paperwork. If you have not heard from us regarding the results in 2 weeks, please contact this office. °  ° ° ° °

## 2019-07-23 LAB — HEPATIC FUNCTION PANEL
ALT: 342 IU/L — ABNORMAL HIGH (ref 0–32)
AST: 156 IU/L — ABNORMAL HIGH (ref 0–40)
Albumin: 4.1 g/dL (ref 3.9–5.0)
Alkaline Phosphatase: 475 IU/L — ABNORMAL HIGH (ref 39–117)
Bilirubin Total: 0.3 mg/dL (ref 0.0–1.2)
Bilirubin, Direct: 0.16 mg/dL (ref 0.00–0.40)
Total Protein: 7.7 g/dL (ref 6.0–8.5)

## 2019-07-23 LAB — LIPID PANEL
Chol/HDL Ratio: 4.7 ratio — ABNORMAL HIGH (ref 0.0–4.4)
Cholesterol, Total: 140 mg/dL (ref 100–199)
HDL: 30 mg/dL — ABNORMAL LOW (ref >39)
LDL Chol Calc (NIH): 78 mg/dL (ref 0–99)
Triglycerides: 187 mg/dL — ABNORMAL HIGH (ref 0–149)
VLDL Cholesterol Cal: 32 mg/dL (ref 5–40)

## 2019-07-23 LAB — TSH: TSH: 2.08 u[IU]/mL (ref 0.450–4.500)

## 2019-07-24 ENCOUNTER — Other Ambulatory Visit: Payer: Self-pay | Admitting: Registered Nurse

## 2019-07-24 ENCOUNTER — Ambulatory Visit (HOSPITAL_COMMUNITY)
Admission: RE | Admit: 2019-07-24 | Discharge: 2019-07-24 | Disposition: A | Discharge status: Home / Self Care | Payer: 59 | Source: Ambulatory Visit | Attending: Registered Nurse | Admitting: Registered Nurse

## 2019-07-24 ENCOUNTER — Telehealth: Payer: Self-pay | Admitting: *Deleted

## 2019-07-24 ENCOUNTER — Encounter: Payer: Self-pay | Admitting: Registered Nurse

## 2019-07-24 ENCOUNTER — Other Ambulatory Visit: Payer: Self-pay

## 2019-07-24 ENCOUNTER — Encounter: Payer: Self-pay | Admitting: Gastroenterology

## 2019-07-24 ENCOUNTER — Telehealth: Payer: Self-pay | Admitting: Registered Nurse

## 2019-07-24 ENCOUNTER — Telehealth: Payer: Self-pay

## 2019-07-24 ENCOUNTER — Ambulatory Visit: Payer: 59

## 2019-07-24 DIAGNOSIS — R7989 Other specified abnormal findings of blood chemistry: Secondary | ICD-10-CM

## 2019-07-24 LAB — INFLUENZA A AND B ANTIGEN (CONVERTED LAB)
Influenza A Ag: NEGATIVE
Influenza B Ag: NEGATIVE

## 2019-07-24 LAB — COMPREHENSIVE METABOLIC PANEL
ALT: 303 IU/L — ABNORMAL HIGH (ref 0–32)
AST: 138 IU/L — ABNORMAL HIGH (ref 0–40)
Albumin/Globulin Ratio: 1.2 (ref 1.2–2.2)
Albumin: 4.1 g/dL (ref 3.9–5.0)
Alkaline Phosphatase: 463 IU/L — ABNORMAL HIGH (ref 39–117)
BUN/Creatinine Ratio: 7 — ABNORMAL LOW (ref 9–23)
BUN: 5 mg/dL — ABNORMAL LOW (ref 6–20)
Bilirubin Total: 0.4 mg/dL (ref 0.0–1.2)
CO2: 22 mmol/L (ref 20–29)
Calcium: 9.7 mg/dL (ref 8.7–10.2)
Chloride: 103 mmol/L (ref 96–106)
Creatinine, Ser: 0.68 mg/dL (ref 0.57–1.00)
GFR calc Af Amer: 143 mL/min/{1.73_m2} (ref >59)
GFR calc non Af Amer: 124 mL/min/{1.73_m2} (ref >59)
Globulin, Total: 3.5 g/dL (ref 1.5–4.5)
Glucose: 98 mg/dL (ref 65–99)
Potassium: 4.2 mmol/L (ref 3.5–5.2)
Sodium: 137 mmol/L (ref 134–144)
Total Protein: 7.6 g/dL (ref 6.0–8.5)

## 2019-07-24 LAB — HEPATITIS PANEL, ACUTE
Hep A IgM: NEGATIVE
Hep B C IgM: NEGATIVE
Hep C Virus Ab: 0.1 s/co ratio (ref 0.0–0.9)
Hepatitis B Surface Ag: NEGATIVE

## 2019-07-24 LAB — POC INFLUENZA A AND B ANTIGEN (URGENT CARE ONLY)
Influenza A Ag: NEGATIVE
Influenza B Ag: NEGATIVE

## 2019-07-24 LAB — GAMMA GT: GGT: 239 IU/L — ABNORMAL HIGH (ref 0–60)

## 2019-07-24 NOTE — Telephone Encounter (Signed)
[facility] called with lab results stating could not reach Primary Care at [facility] and asked [place] to call until reached for Hepatitis lab results as follow: Hep A Ab IgM negative Hep B surface negative Hep B Core Ab, IgM negative Hep C Virus Ab 0.1  [facility] [place] called, results given.

## 2019-07-24 NOTE — Telephone Encounter (Signed)
[facility] agent called stating [facility] had notified them of abnormal Stat results.  Provider aware.

## 2019-07-27 ENCOUNTER — Ambulatory Visit (INDEPENDENT_AMBULATORY_CARE_PROVIDER_SITE_OTHER): Payer: 59 | Admitting: Registered Nurse

## 2019-07-27 ENCOUNTER — Other Ambulatory Visit: Payer: Self-pay

## 2019-07-27 ENCOUNTER — Other Ambulatory Visit: Payer: Self-pay | Admitting: Registered Nurse

## 2019-07-27 ENCOUNTER — Encounter: Payer: Self-pay | Admitting: Registered Nurse

## 2019-07-27 DIAGNOSIS — R7989 Other specified abnormal findings of blood chemistry: Secondary | ICD-10-CM

## 2019-07-27 NOTE — Telephone Encounter (Signed)
This entry was in error, see prior entry with lab results routed to practice.

## 2019-07-28 ENCOUNTER — Telehealth: Payer: Self-pay | Admitting: Registered Nurse

## 2019-07-28 ENCOUNTER — Encounter: Payer: Self-pay | Admitting: Registered Nurse

## 2019-07-28 DIAGNOSIS — R7989 Other specified abnormal findings of blood chemistry: Secondary | ICD-10-CM

## 2019-07-28 NOTE — Progress Notes (Signed)
LFTs and GGT falling appropriately.  Continue to avoid EtOH and tylenol Recheck in 2-3 weeks  Jari Sportsman, NP

## 2019-07-28 NOTE — Telephone Encounter (Signed)
Called pt to schedule, no answer and I lvm

## 2019-07-28 NOTE — Telephone Encounter (Signed)
-----   Message from Janeece Agee, NP sent at 07/28/2019  8:06 AM EST ----- Regarding: Lab only 2-3 weeks Hello,  The attached patient needs a lab only visit in 2-3 weeks. Ideally fasting. I have sent her a MyChart message letting her know to expect a call  Thank you  Jari Sportsman, NP

## 2019-07-31 LAB — PATHOLOGIST SMEAR REVIEW
Basophils Absolute: 0.2 10*3/uL (ref 0.0–0.2)
Basos: 2 %
EOS (ABSOLUTE): 0 10*3/uL (ref 0.0–0.4)
Eos: 0 %
Hematocrit: 33.8 % — ABNORMAL LOW (ref 34.0–46.6)
Hemoglobin: 11.3 g/dL (ref 11.1–15.9)
Immature Grans (Abs): 0 10*3/uL (ref 0.0–0.1)
Immature Granulocytes: 0 %
Lymphocytes Absolute: 8.3 10*3/uL — ABNORMAL HIGH (ref 0.7–3.1)
Lymphs: 82 %
MCH: 28.2 pg (ref 26.6–33.0)
MCHC: 33.4 g/dL (ref 31.5–35.7)
MCV: 84 fL (ref 79–97)
Monocytes Absolute: 0.5 10*3/uL (ref 0.1–0.9)
Monocytes: 5 %
Neutrophils Absolute: 1.1 10*3/uL — ABNORMAL LOW (ref 1.4–7.0)
Neutrophils: 11 %
Path Rev PLTs: NORMAL
Path Rev RBC: NORMAL
Platelets: 265 10*3/uL (ref 150–450)
RBC: 4.01 x10E6/uL (ref 3.77–5.28)
RDW: 14.1 % (ref 11.7–15.4)
WBC: 10 10*3/uL (ref 3.4–10.8)

## 2019-07-31 LAB — HEPATIC FUNCTION PANEL
ALT: 222 IU/L — ABNORMAL HIGH (ref 0–32)
AST: 101 IU/L — ABNORMAL HIGH (ref 0–40)
Albumin: 4 g/dL (ref 3.9–5.0)
Alkaline Phosphatase: 363 IU/L — ABNORMAL HIGH (ref 39–117)
Bilirubin Total: 0.4 mg/dL (ref 0.0–1.2)
Bilirubin, Direct: 0.15 mg/dL (ref 0.00–0.40)
Total Protein: 7.5 g/dL (ref 6.0–8.5)

## 2019-07-31 LAB — IRON,TIBC AND FERRITIN PANEL
Ferritin: 35 ng/mL (ref 15–150)
Iron Saturation: 8 % — CL (ref 15–55)
Iron: 31 ug/dL (ref 27–159)
Total Iron Binding Capacity: 397 ug/dL (ref 250–450)
UIBC: 366 ug/dL (ref 131–425)

## 2019-07-31 LAB — GAMMA GT: GGT: 189 IU/L — ABNORMAL HIGH (ref 0–60)

## 2019-08-04 ENCOUNTER — Encounter: Payer: Self-pay | Admitting: Registered Nurse

## 2019-08-17 ENCOUNTER — Other Ambulatory Visit: Payer: Self-pay

## 2019-08-17 ENCOUNTER — Ambulatory Visit: Payer: 59

## 2019-08-17 DIAGNOSIS — R7989 Other specified abnormal findings of blood chemistry: Secondary | ICD-10-CM

## 2019-08-18 ENCOUNTER — Encounter: Payer: Self-pay | Admitting: Registered Nurse

## 2019-08-18 LAB — GAMMA GT: GGT: 39 IU/L (ref 0–60)

## 2019-08-18 LAB — HEPATIC FUNCTION PANEL
ALT: 27 IU/L (ref 0–32)
AST: 22 IU/L (ref 0–40)
Albumin: 4.3 g/dL (ref 3.9–5.0)
Alkaline Phosphatase: 126 IU/L — ABNORMAL HIGH (ref 39–117)
Bilirubin Total: 0.3 mg/dL (ref 0.0–1.2)
Bilirubin, Direct: 0.08 mg/dL (ref 0.00–0.40)
Total Protein: 7.5 g/dL (ref 6.0–8.5)

## 2019-08-18 NOTE — Progress Notes (Signed)
LFTs falling to normal range No further concerns at this time  Jari Sportsman, NP

## 2019-08-21 ENCOUNTER — Ambulatory Visit: Payer: 59 | Admitting: Gastroenterology

## 2019-10-08 ENCOUNTER — Other Ambulatory Visit: Payer: Self-pay | Admitting: Registered Nurse

## 2019-10-08 DIAGNOSIS — N946 Dysmenorrhea, unspecified: Secondary | ICD-10-CM

## 2019-10-08 MED ORDER — CELECOXIB 200 MG PO CAPS: 200.0000 mg | ORAL_CAPSULE | Freq: Every day | ORAL | 0 refills | Status: DC

## 2019-10-21 ENCOUNTER — Ambulatory Visit: Payer: 59 | Admitting: Registered Nurse

## 2019-12-04 ENCOUNTER — Other Ambulatory Visit: Payer: Self-pay

## 2019-12-04 ENCOUNTER — Emergency Department (HOSPITAL_COMMUNITY)
Admission: EM | Admit: 2019-12-04 | Discharge: 2019-12-05 | Discharge status: Against Medical Advice | Payer: Commercial Managed Care - PPO | Attending: Emergency Medicine | Admitting: Emergency Medicine

## 2019-12-04 ENCOUNTER — Encounter (HOSPITAL_COMMUNITY): Payer: Self-pay | Admitting: Emergency Medicine

## 2019-12-04 DIAGNOSIS — Z5321 Procedure and treatment not carried out due to patient leaving prior to being seen by health care provider: Secondary | ICD-10-CM | POA: Diagnosis not present

## 2019-12-04 DIAGNOSIS — F410 Panic disorder [episodic paroxysmal anxiety] without agoraphobia: Secondary | ICD-10-CM | POA: Diagnosis not present

## 2019-12-04 NOTE — ED Triage Notes (Signed)
Pt states she took CVD for anxiety prior to come to ED and she feels like she is having a reaction to it. Pt is very anxious at triage and screaming at times.

## 2019-12-05 LAB — COMPREHENSIVE METABOLIC PANEL
ALT: 18 U/L (ref 0–44)
AST: 21 U/L (ref 15–41)
Albumin: 3.9 g/dL (ref 3.5–5.0)
Alkaline Phosphatase: 67 U/L (ref 38–126)
Anion gap: 11 (ref 5–15)
BUN: 9 mg/dL (ref 6–20)
CO2: 20 mmol/L — ABNORMAL LOW (ref 22–32)
Calcium: 9.3 mg/dL (ref 8.9–10.3)
Chloride: 104 mmol/L (ref 98–111)
Creatinine, Ser: 0.77 mg/dL (ref 0.44–1.00)
GFR calc Af Amer: >60 mL/min (ref >60)
GFR calc non Af Amer: >60 mL/min (ref >60)
Glucose, Bld: 187 mg/dL — ABNORMAL HIGH (ref 70–99)
Potassium: 3.1 mmol/L — ABNORMAL LOW (ref 3.5–5.1)
Sodium: 135 mmol/L (ref 135–145)
Total Bilirubin: 0.6 mg/dL (ref 0.3–1.2)
Total Protein: 7 g/dL (ref 6.5–8.1)

## 2019-12-05 LAB — CBC
HCT: 33.9 % — ABNORMAL LOW (ref 36.0–46.0)
Hemoglobin: 11 g/dL — ABNORMAL LOW (ref 12.0–15.0)
MCH: 27 pg (ref 26.0–34.0)
MCHC: 32.4 g/dL (ref 30.0–36.0)
MCV: 83.3 fL (ref 80.0–100.0)
Platelets: 395 10*3/uL (ref 150–400)
RBC: 4.07 MIL/uL (ref 3.87–5.11)
RDW: 14.7 % (ref 11.5–15.5)
WBC: 9.1 10*3/uL (ref 4.0–10.5)
nRBC: 0 % (ref 0.0–0.2)

## 2019-12-05 LAB — I-STAT BETA HCG BLOOD, ED (MC, WL, AP ONLY): I-stat hCG, quantitative: <5 m[IU]/mL (ref <5)

## 2019-12-05 LAB — ETHANOL: Alcohol, Ethyl (B): <10 mg/dL (ref <10)

## 2019-12-05 NOTE — ED Notes (Signed)
Duard Brady, fiance, would like to be contacted at [phone]

## 2019-12-05 NOTE — ED Notes (Signed)
Pt has left did not answer for vitals

## 2019-12-05 NOTE — ED Notes (Signed)
Pt in triage  

## 2020-01-01 ENCOUNTER — Encounter: Payer: Self-pay | Admitting: Registered Nurse

## 2020-01-01 ENCOUNTER — Ambulatory Visit: Payer: Self-pay

## 2020-01-01 ENCOUNTER — Ambulatory Visit (INDEPENDENT_AMBULATORY_CARE_PROVIDER_SITE_OTHER): Payer: Commercial Managed Care - PPO | Admitting: Registered Nurse

## 2020-01-01 ENCOUNTER — Other Ambulatory Visit: Payer: Self-pay | Admitting: Registered Nurse

## 2020-01-01 ENCOUNTER — Other Ambulatory Visit: Payer: Self-pay

## 2020-01-01 VITALS — BP 115/90 | HR 100 | Temp 98.0°F | Resp 17 | Ht 60.0 in | Wt 180.2 lb

## 2020-01-01 DIAGNOSIS — N920 Excessive and frequent menstruation with regular cycle: Secondary | ICD-10-CM

## 2020-01-01 DIAGNOSIS — K644 Residual hemorrhoidal skin tags: Secondary | ICD-10-CM | POA: Diagnosis not present

## 2020-01-01 DIAGNOSIS — J4520 Mild intermittent asthma, uncomplicated: Secondary | ICD-10-CM

## 2020-01-01 DIAGNOSIS — Z8719 Personal history of other diseases of the digestive system: Secondary | ICD-10-CM | POA: Diagnosis not present

## 2020-01-01 MED ORDER — HYDROCORTISONE 2.5 % EX KIT: 1.0000 "application " | PACK | Freq: Two times a day (BID) | CUTANEOUS | 3 refills | Status: DC

## 2020-01-01 MED ORDER — HYDROCORTISONE 2.5 % EX CREA: TOPICAL_CREAM | Freq: Two times a day (BID) | CUTANEOUS | 0 refills | Status: DC

## 2020-01-01 MED ORDER — ALBUTEROL SULFATE HFA 108 (90 BASE) MCG/ACT IN AERS: 2.0000 | INHALATION_SPRAY | Freq: Four times a day (QID) | RESPIRATORY_TRACT | 10 refills | Status: DC | PRN

## 2020-01-01 NOTE — Patient Instructions (Signed)
° ° ° °  If you have lab work done today you will be contacted with your lab results within the next 2 weeks.  If you have not heard from us then please contact us. The fastest way to get your results is to register for My Chart. ° ° °IF you received an x-ray today, you will receive an invoice from [facility]. Please contact [facility] at [phone] with questions or concerns regarding your invoice.  ° °IF you received labwork today, you will receive an invoice from [facility]. Please contact [facility] at [phone] with questions or concerns regarding your invoice.  ° °Our billing staff will not be able to assist you with questions regarding bills from these companies. ° °You will be contacted with the lab results as soon as they are available. The fastest way to get your results is to activate your My Chart account. Instructions are located on the last page of this paperwork. If you have not heard from us regarding the results in 2 weeks, please contact this office. °  ° ° ° °

## 2020-01-01 NOTE — Telephone Encounter (Signed)
Pt had an episode of dizziness following lunch today after her visit is feeling better now but wants to know if she should do anything more for her dehydration?

## 2020-01-01 NOTE — Telephone Encounter (Signed)
Pt. Seen today in the office. When she left got lunch, went home and had an episode of dizziness and laid down. Feels better now. States she was told in the office she was dehydrated. She is drinking water now and will continue to do so.Fiance is with her. Instructed to change positions slowly. Wants to know is there anything else she needs to do. Please advise pt.  Answer Assessment - Initial Assessment Questions 1. DESCRIPTION: "Describe your dizziness."     Dizzy 2. LIGHTHEADED: "Do you feel lightheaded?" (e.g., somewhat faint, woozy, weak upon standing)     Yes 3. VERTIGO: "Do you feel like either you or the room is spinning or tilting?" (i.e. vertigo)     Yes 4. SEVERITY: "How bad is it?"  "Do you feel like you are going to faint?" "Can you stand and walk?"   - MILD: Feels slightly dizzy, but walking normally.   - MODERATE: Feels very unsteady when walking, but not falling; interferes with normal activities (e.g., school, work) .   - SEVERE: Unable to walk without falling, or requires assistance to walk without falling; feels like passing out now.      Mild 5. ONSET:  "When did the dizziness begin?"     A few minutes 6. AGGRAVATING FACTORS: "Does anything make it worse?" (e.g., standing, change in head position)     No 7. HEART RATE: "Can you tell me your heart rate?" "How many beats in 15 seconds?"  (Note: not all patients can do this)       75 8. CAUSE: "What do you think is causing the dizziness?"     No 9. RECURRENT SYMPTOM: "Have you had dizziness before?" If Yes, ask: "When was the last time?" "What happened that time?"     Yes 10. OTHER SYMPTOMS: "Do you have any other symptoms?" (e.g., fever, chest pain, vomiting, diarrhea, bleeding)       No 11. PREGNANCY: "Is there any chance you are pregnant?" "When was your last menstrual period?"       No  Protocols used: DIZZINESS [facility]

## 2020-01-01 NOTE — Telephone Encounter (Signed)
Pt informed voiced understanding and stated she would follow up with [place] if she had further questions

## 2020-01-01 NOTE — Progress Notes (Signed)
Acute Office Visit  Subjective:    Patient ID: Pamela Montoya, female    DOB: [birth year], 24 y.o.   MRN: [id number]  Chief Complaint  Patient presents with  . Blood In Stools    Patient states she has been having some bleeeding and mucus while on her cycle she states this has happened since 11/26/2019 but this time she noticed a little more on 6/25-6/26. Per patient she has no other qquestions or concerns.    HPI Patient is in today for blood in stool.  Occurring with menses. Once in may, once in June. Pain at rectum with defecation. Blood is bright red. Worst on first two days of menses. Then improving. Usually just when wiping, but occ can see it in toilet bowl. No lightheadedness, dizziness, shob - pt not concerned for anemia Menses are heavy at baseline, pretty crampy at baseline. Cramps not always relieved by defecation  Pt needs refill on albuterol inhaler. Tolerating well. Works well for symptoms which occur infrequently.  Past Medical History:  Diagnosis Date  . Asthma   . IBS (irritable bowel syndrome)   . Pseudotumor cerebri 2007    Past Surgical History:  Procedure Laterality Date  . Spinal tap    . WISDOM TOOTH EXTRACTION      Family History  Problem Relation Age of Onset  . Hypertension Father   . Lung cancer Maternal Grandmother   . Heart attack Maternal Grandfather   . Dementia Maternal Grandfather   . Colon cancer Paternal Grandmother   . Heart defect Sister     Social History   Socioeconomic History  . Marital status: Significant Other    Spouse name: Not on file  . Number of children: 0  . Years of education: Not on file  . Highest education level: Not on file  Occupational History  . Not on file  Tobacco Use  . Smoking status: Never Smoker  . Smokeless tobacco: Never Used  Vaping Use  . Vaping Use: Never used  Substance and Sexual Activity  . Alcohol use: Yes    Comment: occ  . Drug use: Never  . Sexual activity: Yes    Birth  control/protection: Condom    Comment: 1st intercourse 24 yo-More than 5 partners  Other Topics Concern  . Not on file  Social History Narrative  . Not on file   Social Determinants of Health   Financial Resource Strain:   . Difficulty of Paying Living Expenses:   Food Insecurity:   . Worried About Programme researcher, broadcasting/film/video in the Last Year:   . Barista in the Last Year:   Transportation Needs:   . Freight forwarder (Medical):   Marland Kitchen Lack of Transportation (Non-Medical):   Physical Activity:   . Days of Exercise per Week:   . Minutes of Exercise per Session:   Stress:   . Feeling of Stress :   Social Connections:   . Frequency of Communication with Friends and Family:   . Frequency of Social Gatherings with Friends and Family:   . Attends Religious Services:   . Active Member of Clubs or Organizations:   . Attends Banker Meetings:   Marland Kitchen Marital Status:   Intimate Partner Violence:   . Fear of Current or Ex-Partner:   . Emotionally Abused:   Marland Kitchen Physically Abused:   . Sexually Abused:     Outpatient Medications Prior to Visit  Medication Sig Dispense Refill  . albuterol (PROVENTIL  HFA;VENTOLIN HFA) 108 (90 Base) MCG/ACT inhaler Inhale into the lungs every 6 (six) hours as needed for wheezing or shortness of breath.    . celecoxib (CELEBREX) 200 MG capsule Take 1 capsule (200 mg total) by mouth daily. TAKE 2 CAPSULES BY MOUTH ON DAY 1 OF MENSES(MAY TAKE 1 ADDITIONAL ON DAY 1 IF NEEDED); THEN TAKE 1 CAPSULE TWICE DAILY FOR REMAINDER OF CYCLE 30 capsule 0  . ibuprofen (ADVIL,MOTRIN) 800 MG tablet Take 1 tablet (800 mg total) by mouth every 8 (eight) hours as needed. 60 tablet 1  . ondansetron (ZOFRAN) 4 MG tablet Take 1 tablet (4 mg total) by mouth every 8 (eight) hours as needed for nausea or vomiting. 20 tablet 0  . ondansetron (ZOFRAN-ODT) 8 MG disintegrating tablet Take 1 tablet (8 mg total) by mouth every 8 (eight) hours as needed for nausea or vomiting.  (Patient not taking: Reported on 01/01/2020) 30 tablet 0  . oseltamivir (TAMIFLU) 75 MG capsule Take 1 capsule (75 mg total) by mouth 2 (two) times daily. (Patient not taking: Reported on 07/22/2019) 10 capsule 0  . Phenylephrine-Pheniramine-DM (THERAFLU COLD & COUGH PO) Take by mouth.     No facility-administered medications prior to visit.    No Known Allergies  Review of Systems  Constitutional: Negative.   HENT: Negative.   Eyes: Negative.   Respiratory: Negative.   Cardiovascular: Negative.   Gastrointestinal: Positive for anal bleeding, blood in stool, diarrhea and rectal pain. Negative for abdominal distention, abdominal pain, constipation, nausea and vomiting.  Endocrine: Negative.   Genitourinary: Positive for menstrual problem. Negative for difficulty urinating, dyspareunia, dysuria, enuresis, flank pain, frequency, genital sores and hematuria.  Musculoskeletal: Negative.   Skin: Negative.   Allergic/Immunologic: Negative.   Neurological: Negative.   Hematological: Negative.   Psychiatric/Behavioral: Negative.   All other systems reviewed and are negative.      Objective:    Physical Exam Vitals and nursing note reviewed.  Constitutional:      General: She is not in acute distress.    Appearance: Normal appearance. She is obese. She is not ill-appearing, toxic-appearing or diaphoretic.  Cardiovascular:     Rate and Rhythm: Normal rate and regular rhythm.     Pulses: Normal pulses.     Heart sounds: Normal heart sounds.  Abdominal:     General: Abdomen is flat. Bowel sounds are normal. There is no distension.     Palpations: Abdomen is soft. There is no mass.     Tenderness: There is no abdominal tenderness. There is no right CVA tenderness, left CVA tenderness, guarding or rebound.     Hernia: No hernia is present.  Genitourinary:   Skin:    General: Skin is warm and dry.     Coloration: Skin is not jaundiced or pale.     Findings: No bruising, erythema,  lesion or rash.  Neurological:     General: No focal deficit present.     Mental Status: She is alert and oriented to person, place, and time. Mental status is at baseline.  Psychiatric:        Mood and Affect: Mood normal.        Behavior: Behavior normal.        Thought Content: Thought content normal.        Judgment: Judgment normal.     BP 115/90   Pulse 100   Temp 98 F (36.7 C) (Temporal)   Resp 17   Ht 5' (1.524 m)  Wt 180 lb 3.2 oz (81.7 kg)   SpO2 98%   BMI 35.19 kg/m  Wt Readings from Last 3 Encounters:  01/01/20 180 lb 3.2 oz (81.7 kg)  12/04/19 175 lb (79.4 kg)  07/22/19 177 lb 6.4 oz (80.5 kg)    Health Maintenance Due  Topic Date Due  . PAP SMEAR-Modifier  Never done    There are no preventive care reminders to display for this patient.   Lab Results  Component Value Date   TSH 2.080 07/22/2019   Lab Results  Component Value Date   WBC 9.1 12/04/2019   HGB 11.0 (L) 12/04/2019   HCT 33.9 (L) 12/04/2019   MCV 83.3 12/04/2019   PLT 395 12/04/2019   Lab Results  Component Value Date   NA 135 12/04/2019   K 3.1 (L) 12/04/2019   CO2 20 (L) 12/04/2019   GLUCOSE 187 (H) 12/04/2019   BUN 9 12/04/2019   CREATININE 0.77 12/04/2019   BILITOT 0.6 12/04/2019   ALKPHOS 67 12/04/2019   AST 21 12/04/2019   ALT 18 12/04/2019   PROT 7.0 12/04/2019   ALBUMIN 3.9 12/04/2019   CALCIUM 9.3 12/04/2019   ANIONGAP 11 12/04/2019   Lab Results  Component Value Date   CHOL 140 07/22/2019   Lab Results  Component Value Date   HDL 30 (L) 07/22/2019   Lab Results  Component Value Date   LDLCALC 78 07/22/2019   Lab Results  Component Value Date   TRIG 187 (H) 07/22/2019   Lab Results  Component Value Date   CHOLHDL 4.7 (H) 07/22/2019   No results found for: HGBA1C     Assessment & Plan:   Problem List Items Addressed This Visit    None    Visit Diagnoses    Menorrhagia with regular cycle    -  Primary   Relevant Orders   CBC with  Differential   Sedimentation Rate   C-reactive protein   US Pelvis Complete   History of IBS       Relevant Orders   CBC with Differential   Sedimentation Rate   C-reactive protein   US Pelvis Complete       No orders of the defined types were placed in this encounter.  PLAN  Given relation to menses, will order pelvic US to rule out endometriosis  anusol for external hemorrhoid that I believe is causing the most of the concern here  Cbc and sed rate/crp collected, will follow up as warranted  Refill albuterol  Patient encouraged to call clinic with any questions, comments, or concerns.  Janeece Agee, NP

## 2020-01-01 NOTE — Telephone Encounter (Signed)
Often in young, healthy women, dizziness can be caused by dehydration, low blood sugar, anemia, or some combination. Eat regular meals with snacks as needed, drink at least 1/2 gallon of water daily, and continue to monitor symptoms. If they recur, may be worthwhile to see urgent care.  Thanks,  Jari Sportsman, NP

## 2020-01-02 LAB — SEDIMENTATION RATE: Sed Rate: 17 mm/hr (ref 0–32)

## 2020-01-02 LAB — CBC WITH DIFFERENTIAL/PLATELET
Basophils Absolute: 0.1 10*3/uL (ref 0.0–0.2)
Basos: 1 %
EOS (ABSOLUTE): 0 10*3/uL (ref 0.0–0.4)
Eos: 1 %
Hematocrit: 34.1 % (ref 34.0–46.6)
Hemoglobin: 11.2 g/dL (ref 11.1–15.9)
Immature Grans (Abs): 0 10*3/uL (ref 0.0–0.1)
Immature Granulocytes: 0 %
Lymphocytes Absolute: 2.7 10*3/uL (ref 0.7–3.1)
Lymphs: 37 %
MCH: 27.5 pg (ref 26.6–33.0)
MCHC: 32.8 g/dL (ref 31.5–35.7)
MCV: 84 fL (ref 79–97)
Monocytes Absolute: 0.5 10*3/uL (ref 0.1–0.9)
Monocytes: 7 %
Neutrophils Absolute: 3.9 10*3/uL (ref 1.4–7.0)
Neutrophils: 54 %
Platelets: 351 10*3/uL (ref 150–450)
RBC: 4.07 x10E6/uL (ref 3.77–5.28)
RDW: 14.9 % (ref 11.7–15.4)
WBC: 7.2 10*3/uL (ref 3.4–10.8)

## 2020-01-02 LAB — C-REACTIVE PROTEIN: CRP: 1 mg/L (ref 0–10)

## 2020-01-16 ENCOUNTER — Other Ambulatory Visit: Payer: Self-pay | Admitting: Registered Nurse

## 2020-01-16 DIAGNOSIS — N946 Dysmenorrhea, unspecified: Secondary | ICD-10-CM

## 2020-01-26 ENCOUNTER — Telehealth: Payer: Self-pay | Admitting: *Deleted

## 2020-01-26 ENCOUNTER — Encounter: Payer: Self-pay | Admitting: *Deleted

## 2020-01-27 NOTE — Telephone Encounter (Signed)
Message sent to patient regarding medication

## 2020-02-02 ENCOUNTER — Encounter: Payer: Self-pay | Admitting: Registered Nurse

## 2020-02-04 ENCOUNTER — Other Ambulatory Visit: Payer: Self-pay | Admitting: Registered Nurse

## 2020-02-04 DIAGNOSIS — R198 Other specified symptoms and signs involving the digestive system and abdomen: Secondary | ICD-10-CM

## 2020-02-08 ENCOUNTER — Ambulatory Visit
Admission: RE | Admit: 2020-02-08 | Discharge: 2020-02-08 | Disposition: A | Discharge status: Home / Self Care | Payer: Commercial Managed Care - PPO | Source: Ambulatory Visit | Attending: Registered Nurse | Admitting: Registered Nurse

## 2020-03-11 ENCOUNTER — Encounter: Payer: Self-pay | Admitting: Obstetrics and Gynecology

## 2020-04-04 ENCOUNTER — Encounter: Payer: Self-pay | Admitting: Registered Nurse

## 2020-05-11 ENCOUNTER — Ambulatory Visit (INDEPENDENT_AMBULATORY_CARE_PROVIDER_SITE_OTHER): Payer: Commercial Managed Care - PPO | Admitting: Registered Nurse

## 2020-05-11 ENCOUNTER — Encounter: Payer: Self-pay | Admitting: Registered Nurse

## 2020-05-11 ENCOUNTER — Other Ambulatory Visit: Payer: Self-pay

## 2020-05-11 VITALS — BP 111/76 | HR 94 | Temp 98.0°F | Resp 18 | Ht 60.0 in | Wt 182.2 lb

## 2020-05-11 DIAGNOSIS — N3000 Acute cystitis without hematuria: Secondary | ICD-10-CM

## 2020-05-11 DIAGNOSIS — Z8349 Family history of other endocrine, nutritional and metabolic diseases: Secondary | ICD-10-CM | POA: Diagnosis not present

## 2020-05-11 DIAGNOSIS — M549 Dorsalgia, unspecified: Secondary | ICD-10-CM | POA: Diagnosis not present

## 2020-05-11 LAB — POCT URINALYSIS DIP (CLINITEK)
Bilirubin, UA: NEGATIVE
Glucose, UA: NEGATIVE mg/dL
Ketones, POC UA: NEGATIVE mg/dL
Nitrite, UA: NEGATIVE
POC PROTEIN,UA: NEGATIVE
Spec Grav, UA: 1.02 (ref 1.010–1.025)
Urobilinogen, UA: 0.2 E.U./dL
pH, UA: 7.5 (ref 5.0–8.0)

## 2020-05-11 MED ORDER — SULFAMETHOXAZOLE-TRIMETHOPRIM 800-160 MG PO TABS: 1.0000 | ORAL_TABLET | Freq: Two times a day (BID) | ORAL | 0 refills | Status: DC

## 2020-05-11 MED ORDER — MELOXICAM 7.5 MG PO TABS: 7.5000 mg | ORAL_TABLET | Freq: Every day | ORAL | 0 refills | Status: DC

## 2020-05-11 NOTE — Patient Instructions (Signed)
° ° ° °  If you have lab work done today you will be contacted with your lab results within the next 2 weeks.  If you have not heard from us then please contact us. The fastest way to get your results is to register for My Chart. ° ° °IF you received an x-ray today, you will receive an invoice from [facility]. Please contact [facility] at [phone] with questions or concerns regarding your invoice.  ° °IF you received labwork today, you will receive an invoice from [facility]. Please contact [facility] at [phone] with questions or concerns regarding your invoice.  ° °Our billing staff will not be able to assist you with questions regarding bills from these companies. ° °You will be contacted with the lab results as soon as they are available. The fastest way to get your results is to activate your My Chart account. Instructions are located on the last page of this paperwork. If you have not heard from us regarding the results in 2 weeks, please contact this office. °  ° ° ° °

## 2020-05-12 ENCOUNTER — Encounter: Payer: Self-pay | Admitting: Registered Nurse

## 2020-05-12 NOTE — Progress Notes (Signed)
Acute Office Visit  Subjective:    Patient ID: Pamela Montoya, female    DOB: 1996/06/25, 24 y.o.   MRN: [id number]  Chief Complaint  Patient presents with  . Urinary Tract Infection    Patient states she feels like she has an UTI . Per patient she has been urinating frequently, discomfort urinating and back pain. she has been taking otc medications that helps but return    HPI Patient is in today for dysuria  Ongoing for a week or two Symptoms waxing and waning as she has been trying a number of otc remedies. Has noted some urgency, pelvic pain, and lower back pain No concern for STI exposure at this time Mild vaginal itching but no discharge or odor  Notes that her menstrual cramps are severe. We had given her celebrex, unfortunately this makes her mildly nauseous. Has been hesitant to take it because of side effects. Interested in alternatives.  Lastly, notes that she had a relative unfortunately pass at a young age with hemachromatosis. She is concerned for her own risk. Would like to discuss how to look into this.  Otherwise feeling well. Denies systemic symptoms.   Past Medical History:  Diagnosis Date  . Asthma   . IBS (irritable bowel syndrome)   . Pseudotumor cerebri 2007    Past Surgical History:  Procedure Laterality Date  . Spinal tap    . WISDOM TOOTH EXTRACTION      Family History  Problem Relation Age of Onset  . Hypertension Father   . Lung cancer Maternal Grandmother   . Heart attack Maternal Grandfather   . Dementia Maternal Grandfather   . Colon cancer Paternal Grandmother   . Heart defect Sister     Social History   Socioeconomic History  . Marital status: Significant Other    Spouse name: Not on file  . Number of children: 0  . Years of education: Not on file  . Highest education level: Not on file  Occupational History  . Not on file  Tobacco Use  . Smoking status: Never Smoker  . Smokeless tobacco: Never Used  Vaping Use  .  Vaping Use: Never used  Substance and Sexual Activity  . Alcohol use: Yes    Comment: occ  . Drug use: Never  . Sexual activity: Yes    Birth control/protection: Condom    Comment: 1st intercourse 24 yo-More than 5 partners  Other Topics Concern  . Not on file  Social History Narrative  . Not on file   Social Determinants of Health   Financial Resource Strain:   . Difficulty of Paying Living Expenses: Not on file  Food Insecurity:   . Worried About Programme researcher, broadcasting/film/video in the Last Year: Not on file  . Ran Out of Food in the Last Year: Not on file  Transportation Needs:   . Lack of Transportation (Medical): Not on file  . Lack of Transportation (Non-Medical): Not on file  Physical Activity:   . Days of Exercise per Week: Not on file  . Minutes of Exercise per Session: Not on file  Stress:   . Feeling of Stress : Not on file  Social Connections:   . Frequency of Communication with Friends and Family: Not on file  . Frequency of Social Gatherings with Friends and Family: Not on file  . Attends Religious Services: Not on file  . Active Member of Clubs or Organizations: Not on file  . Attends Banker  Meetings: Not on file  . Marital Status: Not on file  Intimate Partner Violence:   . Fear of Current or Ex-Partner: Not on file  . Emotionally Abused: Not on file  . Physically Abused: Not on file  . Sexually Abused: Not on file    Outpatient Medications Prior to Visit  Medication Sig Dispense Refill  . albuterol (VENTOLIN HFA) 108 (90 Base) MCG/ACT inhaler Inhale 2 puffs into the lungs every 6 (six) hours as needed for wheezing or shortness of breath. 18 g 10  . celecoxib (CELEBREX) 200 MG capsule TAKE 2 CAPSULES BY MOUTH ON DAY 1 OF MENSES(MAY TAKE ADDITIONAL IF NEEDED), THEN TAKE 1 CAPSULE TWICE DAILY FOR REMAINDER OF CYCLE (Patient not taking: Reported on 05/11/2020) 30 capsule 0  . hydrocortisone 2.5 % cream Apply topically 2 (two) times daily. 30 g 0   No  facility-administered medications prior to visit.    No Known Allergies  Review of Systems  Constitutional: Negative.   HENT: Negative.   Eyes: Negative.   Respiratory: Negative.   Cardiovascular: Negative.   Gastrointestinal: Negative.   Genitourinary: Negative.   Musculoskeletal: Negative.   Skin: Negative.   Neurological: Negative.   Psychiatric/Behavioral: Negative.        Objective:    Physical Exam Vitals and nursing note reviewed.  Constitutional:      Appearance: Normal appearance.  Cardiovascular:     Rate and Rhythm: Normal rate and regular rhythm.  Pulmonary:     Effort: Pulmonary effort is normal. No respiratory distress.  Skin:    General: Skin is warm and dry.  Neurological:     General: No focal deficit present.     Mental Status: She is alert and oriented to person, place, and time. Mental status is at baseline.  Psychiatric:        Mood and Affect: Mood normal.        Behavior: Behavior normal.        Thought Content: Thought content normal.        Judgment: Judgment normal.     BP 111/76   Pulse 94   Temp 98 F (36.7 C) (Temporal)   Resp 18   Ht 5' (1.524 m)   Wt 182 lb 3.2 oz (82.6 kg)   SpO2 100%   BMI 35.58 kg/m  Wt Readings from Last 3 Encounters:  05/11/20 182 lb 3.2 oz (82.6 kg)  01/01/20 180 lb 3.2 oz (81.7 kg)  12/04/19 175 lb (79.4 kg)    Health Maintenance Due  Topic Date Due  . PAP SMEAR-Modifier  Never done    There are no preventive care reminders to display for this patient.   Lab Results  Component Value Date   TSH 2.080 07/22/2019   Lab Results  Component Value Date   WBC 7.2 01/01/2020   HGB 11.2 01/01/2020   HCT 34.1 01/01/2020   MCV 84 01/01/2020   PLT 351 01/01/2020   Lab Results  Component Value Date   NA 135 12/04/2019   K 3.1 (L) 12/04/2019   CO2 20 (L) 12/04/2019   GLUCOSE 187 (H) 12/04/2019   BUN 9 12/04/2019   CREATININE 0.77 12/04/2019   BILITOT 0.6 12/04/2019   ALKPHOS 67 12/04/2019     AST 21 12/04/2019   ALT 18 12/04/2019   PROT 7.0 12/04/2019   ALBUMIN 3.9 12/04/2019   CALCIUM 9.3 12/04/2019   ANIONGAP 11 12/04/2019   Lab Results  Component Value Date   CHOL 140  07/22/2019   Lab Results  Component Value Date   HDL 30 (L) 07/22/2019   Lab Results  Component Value Date   LDLCALC 78 07/22/2019   Lab Results  Component Value Date   TRIG 187 (H) 07/22/2019   Lab Results  Component Value Date   CHOLHDL 4.7 (H) 07/22/2019   No results found for: HGBA1C     Assessment & Plan:   Problem List Items Addressed This Visit    None    Visit Diagnoses    Back pain, unspecified back location, unspecified back pain laterality, unspecified chronicity    -  Primary   Relevant Medications   meloxicam (MOBIC) 7.5 MG tablet   Other Relevant Orders   POCT URINALYSIS DIP (CLINITEK) (Completed)   Acute cystitis without hematuria       Relevant Medications   sulfamethoxazole-trimethoprim (BACTRIM DS) 800-160 MG tablet   Other Relevant Orders   Urine Culture   Family history of hemochromatosis       Relevant Orders   CBC   Iron, TIBC and Ferritin Panel   Hepatic Function Panel       Meds ordered this encounter  Medications  . sulfamethoxazole-trimethoprim (BACTRIM DS) 800-160 MG tablet    Sig: Take 1 tablet by mouth 2 (two) times daily.    Dispense:  6 tablet    Refill:  0    Order Specific Question:   Supervising Provider    Answer:   Neva Seat, JEFFREY R [[id number]]  . meloxicam (MOBIC) 7.5 MG tablet    Sig: Take 1 tablet (7.5 mg total) by mouth daily.    Dispense:  30 tablet    Refill:  0    Order Specific Question:   Supervising Provider    Answer:   Neva Seat, JEFFREY R [[id number]]   PLAN  POCT UA suspicious for UTI. Will treat as such. Bactrim po bid for three days. Culture sent.  For menstrual cramps try meloxicam once daily, supplement with tylenol if needed. If no relief can consider GYN referral.  Return for labs at your convenience. Will draw LFTs,  CBC, and iron studies to investigate iron status. Can also consider geneticist referral in the future.   Patient encouraged to call clinic with any questions, comments, or concerns.   Janeece Agee, NP

## 2020-05-13 ENCOUNTER — Ambulatory Visit: Payer: Commercial Managed Care - PPO

## 2020-05-13 LAB — URINE CULTURE

## 2020-05-15 ENCOUNTER — Encounter: Payer: Self-pay | Admitting: Registered Nurse

## 2020-05-17 ENCOUNTER — Other Ambulatory Visit: Payer: Self-pay | Admitting: Registered Nurse

## 2020-05-17 ENCOUNTER — Ambulatory Visit (INDEPENDENT_AMBULATORY_CARE_PROVIDER_SITE_OTHER): Payer: Commercial Managed Care - PPO | Admitting: Registered Nurse

## 2020-05-17 ENCOUNTER — Other Ambulatory Visit: Payer: Self-pay

## 2020-05-17 DIAGNOSIS — R109 Unspecified abdominal pain: Secondary | ICD-10-CM

## 2020-05-17 DIAGNOSIS — M549 Dorsalgia, unspecified: Secondary | ICD-10-CM

## 2020-05-17 DIAGNOSIS — R300 Dysuria: Secondary | ICD-10-CM

## 2020-05-17 LAB — POCT URINALYSIS DIP (MANUAL ENTRY)
Bilirubin, UA: NEGATIVE
Glucose, UA: NEGATIVE mg/dL
Ketones, POC UA: NEGATIVE mg/dL
Nitrite, UA: NEGATIVE
Protein Ur, POC: NEGATIVE mg/dL
Spec Grav, UA: 1.015 (ref 1.010–1.025)
Urobilinogen, UA: 0.2 E.U./dL
pH, UA: 6.5 (ref 5.0–8.0)

## 2020-05-17 MED ORDER — NITROFURANTOIN MONOHYD MACRO 100 MG PO CAPS: 100.0000 mg | ORAL_CAPSULE | Freq: Two times a day (BID) | ORAL | 0 refills | Status: AC

## 2020-05-17 NOTE — Patient Instructions (Signed)
° ° ° °  If you have lab work done today you will be contacted with your lab results within the next 2 weeks.  If you have not heard from us then please contact us. The fastest way to get your results is to register for My Chart. ° ° °IF you received an x-ray today, you will receive an invoice from [facility]. Please contact [facility] at [phone] with questions or concerns regarding your invoice.  ° °IF you received labwork today, you will receive an invoice from [facility]. Please contact [facility] at [phone] with questions or concerns regarding your invoice.  ° °Our billing staff will not be able to assist you with questions regarding bills from these companies. ° °You will be contacted with the lab results as soon as they are available. The fastest way to get your results is to activate your My Chart account. Instructions are located on the last page of this paperwork. If you have not heard from us regarding the results in 2 weeks, please contact this office. °  ° ° ° °

## 2020-05-17 NOTE — Progress Notes (Signed)
Bactrim not effective. Will send macrobid Will send culture  Jari Sportsman, NP

## 2020-05-19 ENCOUNTER — Ambulatory Visit: Payer: Self-pay

## 2020-05-19 LAB — URINE CULTURE

## 2020-05-19 NOTE — Telephone Encounter (Signed)
Phone note reviewed.  Started on macrobid for UTI 2 days ago.  Dizziness started yesterday evening, then again today.  Saw urgent care, recommended stopping macrodantin, starting cipro. She has continued macrodantin - wanted to check with [place] first. Another dizzy spell today after taking meds a few hrs later. Nausea, no vomiting or fever.   Recommended stopping macraodantin, start cipro as Rx at Urgent care.  Tendinopathy risks discussed. Rtc/ER precautions if any new or worsening back pain, abdominal pain or fever.  If dizziness does not improve off Macrodantin also recommended evaluation.  Understanding expressed

## 2020-05-19 NOTE — Telephone Encounter (Addendum)
Patient called and says since last evening after taking Macrobid, she's been having dizzy spells. She says she took a dose yesterday morning, then around 5-6 pm, she became dizzy. This morning she wasn't dizzy, but after taking another dose Macrobid, she became dizzy. She says she went to the UC, but nothing was found. So she figured it was the medication. She also reports feeling nauseated and tired. I advised I will send this to the office for Richard to review and someone will call back with his recommendation. Office called and spoke to [place], [facility] who says Gerlene Burdock is off today, but she will make sure nursing is aware of this.  Reason for Disposition . [1] Caller has URGENT medicine question about med that PCP or specialist prescribed AND [2] triager unable to answer question  Answer Assessment - Initial Assessment Questions 1. NAME of MEDICATION: "What medicine are you calling about?"     Macrobid 2. QUESTION: "What is your question?" (e.g., medication refill, side effect)     Dizziness started yesterday 3. PRESCRIBING HCP: "Who prescribed it?" Reason: if prescribed by specialist, call should be referred to that group.     Janeece Agee, NP 4. SYMPTOMS: "Do you have any symptoms?"     Dizziness, nause 5. SEVERITY: If symptoms are present, ask "Are they mild, moderate or severe?"     Moderate 6. PREGNANCY:  "Is there any chance that you are pregnant?" "When was your last menstrual period?"     No  Protocols used: MEDICATION QUESTION CALL-A-AH

## 2020-05-20 ENCOUNTER — Encounter: Payer: Self-pay | Admitting: Registered Nurse

## 2020-05-22 ENCOUNTER — Other Ambulatory Visit: Payer: Self-pay

## 2020-05-22 ENCOUNTER — Encounter (HOSPITAL_COMMUNITY): Payer: Self-pay | Admitting: Emergency Medicine

## 2020-05-22 ENCOUNTER — Emergency Department (HOSPITAL_COMMUNITY)
Admission: EM | Admit: 2020-05-22 | Discharge: 2020-05-22 | Disposition: A | Discharge status: Home / Self Care | Payer: Commercial Managed Care - PPO | Attending: Emergency Medicine | Admitting: Emergency Medicine

## 2020-05-22 ENCOUNTER — Emergency Department (HOSPITAL_COMMUNITY): Payer: Commercial Managed Care - PPO

## 2020-05-22 DIAGNOSIS — N390 Urinary tract infection, site not specified: Secondary | ICD-10-CM

## 2020-05-22 DIAGNOSIS — N739 Female pelvic inflammatory disease, unspecified: Secondary | ICD-10-CM | POA: Insufficient documentation

## 2020-05-22 DIAGNOSIS — J45909 Unspecified asthma, uncomplicated: Secondary | ICD-10-CM | POA: Diagnosis not present

## 2020-05-22 DIAGNOSIS — N730 Acute parametritis and pelvic cellulitis: Secondary | ICD-10-CM

## 2020-05-22 DIAGNOSIS — R109 Unspecified abdominal pain: Secondary | ICD-10-CM

## 2020-05-22 LAB — CBC WITH DIFFERENTIAL/PLATELET
Abs Immature Granulocytes: 0.02 10*3/uL (ref 0.00–0.07)
Basophils Absolute: 0.1 10*3/uL (ref 0.0–0.1)
Basophils Relative: 1 %
Eosinophils Absolute: 0.1 10*3/uL (ref 0.0–0.5)
Eosinophils Relative: 1 %
HCT: 37.7 % (ref 36.0–46.0)
Hemoglobin: 12.2 g/dL (ref 12.0–15.0)
Immature Granulocytes: 0 %
Lymphocytes Relative: 34 %
Lymphs Abs: 3 10*3/uL (ref 0.7–4.0)
MCH: 27.9 pg (ref 26.0–34.0)
MCHC: 32.4 g/dL (ref 30.0–36.0)
MCV: 86.1 fL (ref 80.0–100.0)
Monocytes Absolute: 0.5 10*3/uL (ref 0.1–1.0)
Monocytes Relative: 5 %
Neutro Abs: 5.1 10*3/uL (ref 1.7–7.7)
Neutrophils Relative %: 59 %
Platelets: 375 10*3/uL (ref 150–400)
RBC: 4.38 MIL/uL (ref 3.87–5.11)
RDW: 14.1 % (ref 11.5–15.5)
WBC: 8.7 10*3/uL (ref 4.0–10.5)
nRBC: 0 % (ref 0.0–0.2)

## 2020-05-22 LAB — URINALYSIS, ROUTINE W REFLEX MICROSCOPIC
Bilirubin Urine: NEGATIVE
Glucose, UA: NEGATIVE mg/dL
Hgb urine dipstick: NEGATIVE
Ketones, ur: 5 mg/dL — AB
Nitrite: NEGATIVE
Protein, ur: NEGATIVE mg/dL
Specific Gravity, Urine: 1.027 (ref 1.005–1.030)
pH: 5 (ref 5.0–8.0)

## 2020-05-22 LAB — WET PREP, GENITAL
Clue Cells Wet Prep HPF POC: NONE SEEN
Sperm: NONE SEEN
Trich, Wet Prep: NONE SEEN
Yeast Wet Prep HPF POC: NONE SEEN

## 2020-05-22 LAB — COMPREHENSIVE METABOLIC PANEL
ALT: 17 U/L (ref 0–44)
AST: 21 U/L (ref 15–41)
Albumin: 4.6 g/dL (ref 3.5–5.0)
Alkaline Phosphatase: 86 U/L (ref 38–126)
Anion gap: 11 (ref 5–15)
BUN: 12 mg/dL (ref 6–20)
CO2: 23 mmol/L (ref 22–32)
Calcium: 9.9 mg/dL (ref 8.9–10.3)
Chloride: 105 mmol/L (ref 98–111)
Creatinine, Ser: 0.58 mg/dL (ref 0.44–1.00)
GFR, Estimated: >60 mL/min (ref >60)
Glucose, Bld: 95 mg/dL (ref 70–99)
Potassium: 3.4 mmol/L — ABNORMAL LOW (ref 3.5–5.1)
Sodium: 139 mmol/L (ref 135–145)
Total Bilirubin: 0.3 mg/dL (ref 0.3–1.2)
Total Protein: 8.3 g/dL — ABNORMAL HIGH (ref 6.5–8.1)

## 2020-05-22 LAB — I-STAT BETA HCG BLOOD, ED (MC, WL, AP ONLY): I-stat hCG, quantitative: <5 m[IU]/mL (ref <5)

## 2020-05-22 MED ORDER — SODIUM CHLORIDE 0.9 % IV SOLN
1.0000 g | Freq: Once | INTRAVENOUS | Status: AC
  Administered 2020-05-22: 1 g via INTRAVENOUS
  Filled 2020-05-22: qty 10

## 2020-05-22 MED ORDER — CEPHALEXIN 500 MG PO CAPS: 500.0000 mg | ORAL_CAPSULE | Freq: Four times a day (QID) | ORAL | 0 refills | Status: DC

## 2020-05-22 MED ORDER — SODIUM CHLORIDE 0.9 % IV BOLUS
1000.0000 mL | Freq: Once | INTRAVENOUS | Status: AC
  Administered 2020-05-22: 1000 mL via INTRAVENOUS

## 2020-05-22 MED ORDER — KETOROLAC TROMETHAMINE 30 MG/ML IJ SOLN
30.0000 mg | Freq: Once | INTRAMUSCULAR | Status: AC
  Administered 2020-05-22: 30 mg via INTRAVENOUS
  Filled 2020-05-22: qty 1

## 2020-05-22 MED ORDER — DOXYCYCLINE HYCLATE 100 MG PO CAPS: 100.0000 mg | ORAL_CAPSULE | Freq: Two times a day (BID) | ORAL | 0 refills | Status: DC

## 2020-05-22 MED ORDER — DOXYCYCLINE HYCLATE 100 MG PO TABS
100.0000 mg | ORAL_TABLET | Freq: Once | ORAL | Status: AC
  Administered 2020-05-22: 100 mg via ORAL
  Filled 2020-05-22: qty 1

## 2020-05-22 MED ORDER — DOXYCYCLINE HYCLATE 100 MG PO CAPS: 100.0000 mg | ORAL_CAPSULE | Freq: Two times a day (BID) | ORAL | 0 refills | Status: AC

## 2020-05-22 NOTE — ED Notes (Signed)
Pt ambulatory to CT

## 2020-05-22 NOTE — Discharge Instructions (Addendum)
Your urine does have some bacteria in it today, this could be a urinary tract infection that is moved up your kidneys.  You were given a dose of IV antibiotics today and should take Keflex 4 times daily for the next week.  We have sent your urine off for culture and you will be called by phone if a different antibiotic would be more appropriate.  Given findings on your pelvic exam is also possible that PID could be contributing to your symptoms.  You should take doxycycline twice daily for the next 2 weeks.  You have STD testing pending you will be called by phone if any results are positive, you could also view results online through my chart.  Please notify any partners of positive results so that they can be tested and treated as well.  Please follow-up with your OB/GYN for recheck.  Return for new or worsening symptoms.

## 2020-05-22 NOTE — ED Provider Notes (Signed)
[facility]-EMERGENCY DEPT Provider Note   CSN: [id number] Arrival date & time: 05/22/20  0700     History Chief Complaint  Patient presents with  . Flank Pain    Pamela Montoya is a 24 y.o. female.  Pamela Montoya is a 24 y.o. female with history of asthma, IBS, and pseudotumor cerebri, who presents to the emergency department for evaluation of flank pain and dysuria.  Patient states she has been dealing with urinary symptoms for the last 3 weeks.  Has been following with urgent care and has been placed on multiple antibiotics.  She was initially put on Bactrim, but this was not improving her symptoms, she [facility], but this medication was making her feel dizzy and lightheaded, so they then placed her on Cipro, but Cipro was causing tendon soreness, and so when she called the urgent care back they told her that her cultures had not grown anything so they just recommended that she discontinue antibiotics.  Since yesterday she has not been on any medication, she reports overnight she is continue to have urinary frequency and some mild dysuria, but that pain has started to move up to her flanks bilaterally.  She has not had fevers or chills but did feel nauseated this morning without vomiting.  She has continued to have some suprapubic pressure and low back pain.  She does not have known history of kidney infections or kidney stones, but does have a family history of both.  She denies vaginal discharge or vaginal bleeding.  Is sexually active with one partner and has low suspicion for sexually transmitted infection.  She states that as she has been following up with the urgent care with these continued symptoms she has not had a pelvic or any testing done to assess this is a possible cause for her symptoms.  States she has been using Azo intermittently to help with discomfort, and has been taking cranberry pills and drinking cranberry juice.         Past Medical History:  Diagnosis Date  . Asthma   . IBS (irritable bowel syndrome)   . Pseudotumor cerebri 2007    There are no problems to display for this patient.   Past Surgical History:  Procedure Laterality Date  . Spinal tap    . WISDOM TOOTH EXTRACTION       OB History    Gravida  0   Para      Term      Preterm      AB      Living        SAB      TAB      Ectopic      Multiple      Live Births              Family History  Problem Relation Age of Onset  . Hypertension Father   . Lung cancer Maternal Grandmother   . Heart attack Maternal Grandfather   . Dementia Maternal Grandfather   . Colon cancer Paternal Grandmother   . Heart defect Sister     Social History   Tobacco Use  . Smoking status: Never Smoker  . Smokeless tobacco: Never Used  Vaping Use  . Vaping Use: Never used  Substance Use Topics  . Alcohol use: Yes    Comment: occ  . Drug use: Never    Home Medications Prior to Admission medications   Medication Sig Start Date End  Date Taking? Authorizing Provider  albuterol (VENTOLIN HFA) 108 (90 Base) MCG/ACT inhaler Inhale 2 puffs into the lungs every 6 (six) hours as needed for wheezing or shortness of breath. 01/01/20  Yes Janeece Agee, NP  ibuprofen (ADVIL) 200 MG tablet Take 200 mg by mouth every 6 (six) hours as needed for moderate pain.   Yes [provider]  Phenazopyridine HCl (AZO DINE PO) Take 1 tablet by mouth daily as needed (For blader).    Yes [provider]  cephALEXin (KEFLEX) 500 MG capsule Take 1 capsule (500 mg total) by mouth 4 (four) times daily. 05/22/20   Dartha Lodge, PA-C  doxycycline (VIBRAMYCIN) 100 MG capsule Take 1 capsule (100 mg total) by mouth 2 (two) times daily for 14 days. 05/22/20 06/05/20  Dartha Lodge, PA-C  meloxicam (MOBIC) 7.5 MG tablet Take 1 tablet (7.5 mg total) by mouth daily. Patient not taking: Reported on 05/22/2020 05/11/20   Janeece Agee, NP   nitrofurantoin, macrocrystal-monohydrate, (MACROBID) 100 MG capsule Take 1 capsule (100 mg total) by mouth 2 (two) times daily for 5 days. Patient not taking: Reported on 05/22/2020 05/17/20 05/22/20  Janeece Agee, NP  sulfamethoxazole-trimethoprim (BACTRIM DS) 800-160 MG tablet Take 1 tablet by mouth 2 (two) times daily. Patient not taking: Reported on 05/22/2020 05/11/20   Janeece Agee, NP    Allergies    Ciprofloxacin and Meloxicam  Review of Systems   Review of Systems  Constitutional: Negative for chills and fever.  HENT: Negative.   Eyes: Negative for visual disturbance.  Respiratory: Negative for cough and shortness of breath.   Cardiovascular: Negative for chest pain.  Gastrointestinal: Positive for abdominal pain and nausea. Negative for vomiting.  Genitourinary: Positive for dysuria, flank pain and frequency. Negative for hematuria, pelvic pain, vaginal bleeding and vaginal discharge.  Musculoskeletal: Positive for back pain. Negative for arthralgias and myalgias.  Skin: Negative for color change and rash.  Neurological: Negative for syncope and light-headedness.  All other systems reviewed and are negative.   Physical Exam Updated Vital Signs BP 127/87 (BP Location: Right Arm)   Pulse 89   Temp 98 F (36.7 C) (Oral)   Resp 19   SpO2 99%   Physical Exam Vitals and nursing note reviewed.  Constitutional:      General: She is not in acute distress.    Appearance: Normal appearance. She is well-developed. She is not ill-appearing or diaphoretic.     Comments: Well-appearing and in no distress   HENT:     Head: Normocephalic and atraumatic.     Mouth/Throat:     Mouth: Mucous membranes are moist.     Pharynx: Oropharynx is clear.  Eyes:     General:        Right eye: No discharge.        Left eye: No discharge.  Cardiovascular:     Rate and Rhythm: Normal rate and regular rhythm.     Heart sounds: Normal heart sounds. No murmur heard.  No friction rub.  No gallop.   Pulmonary:     Effort: Pulmonary effort is normal. No respiratory distress.     Breath sounds: Normal breath sounds. No wheezing or rales.     Comments: Respirations equal and unlabored, patient able to speak in full sentences, lungs clear to auscultation bilaterally Abdominal:     General: Bowel sounds are normal. There is no distension.     Palpations: Abdomen is soft. There is no mass.  Tenderness: There is no abdominal tenderness. There is no guarding.     Comments: Abdomen soft, nondistended, nontender to palpation in all quadrants without guarding or peritoneal signs, pt with mild CVA tenderness bilaterally  Genitourinary:    Comments: Chaperone present during pelvic exam. No external genital lesions noted. On speculum exam there is a moderate amount of white discharge present and there is some cervical erythema and friability, with swabbing small amount of bleeding noted at the cervical os. On bimanual exam patient has some discomfort, does not have frank cervical motion tenderness but does report some mild pain with palpation over the uterus and left adnexa, no palpable masses or right adnexal tenderness Musculoskeletal:        General: No deformity.     Cervical back: Neck supple.  Skin:    General: Skin is warm and dry.     Capillary Refill: Capillary refill takes less than 2 seconds.  Neurological:     Mental Status: She is alert.     Coordination: Coordination normal.     Comments: Speech is clear, able to follow commands Moves extremities without ataxia, coordination intact  Psychiatric:        Mood and Affect: Mood normal.        Behavior: Behavior normal.     ED Results / Procedures / Treatments   Labs (all labs ordered are listed, but only abnormal results are displayed) Labs Reviewed  WET PREP, GENITAL - Abnormal; Notable for the following components:      Result Value   WBC, Wet Prep HPF POC MANY (*)    All other components within normal limits   URINALYSIS, ROUTINE W REFLEX MICROSCOPIC - Abnormal; Notable for the following components:   APPearance CLOUDY (*)    Ketones, ur 5 (*)    Leukocytes,Ua MODERATE (*)    Bacteria, UA MANY (*)    All other components within normal limits  COMPREHENSIVE METABOLIC PANEL - Abnormal; Notable for the following components:   Potassium 3.4 (*)    Total Protein 8.3 (*)    All other components within normal limits  URINE CULTURE  CBC WITH DIFFERENTIAL/PLATELET  I-STAT BETA HCG BLOOD, ED ([facility], [facility], [facility] ONLY)  GC/CHLAMYDIA PROBE AMP ([place]) NOT AT [facility]    EKG None  Radiology CT Renal Stone Study  Result Date: 05/22/2020 CLINICAL DATA:  Bilateral flank pain.  UTI 3 weeks ago. EXAM: CT ABDOMEN AND PELVIS WITHOUT CONTRAST TECHNIQUE: Multidetector CT imaging of the abdomen and pelvis was performed following the standard protocol without IV contrast. COMPARISON:  None. FINDINGS: Lower chest: Lung bases are clear. Hepatobiliary: Liver, gallbladder and biliary tree are normal. Pancreas: Normal. Spleen: Normal. Adrenals/Urinary Tract: Adrenal glands are normal. Kidneys are normal in size without hydronephrosis or nephrolithiasis. No perinephric inflammation or fluid. Ureters and bladder are normal. Stomach/Bowel: Stomach and small bowel are normal. Appendix is normal. Colon is unremarkable. Vascular/Lymphatic: Normal. Reproductive: Normal. Other: No free fluid or focal inflammatory change. Musculoskeletal: No focal abnormality. IMPRESSION: No acute findings in the abdomen/pelvis. No renal/ureteral stones or obstruction. Electronically Signed   By: Elberta Fortis M.D.   On: 05/22/2020 09:31    Procedures Procedures (including critical care time)  Medications Ordered in ED Medications  sodium chloride 0.9 % bolus 1,000 mL (0 mLs Intravenous Stopped 05/22/20 1010)  ketorolac (TORADOL) 30 MG/ML injection 30 mg (30 mg Intravenous Given 05/22/20 0946)  cefTRIAXone (ROCEPHIN) 1 g in sodium chloride 0.9 %  100 mL IVPB (0 g  Intravenous Stopped 05/22/20 1239)  doxycycline (VIBRA-TABS) tablet 100 mg (100 mg Oral Given 05/22/20 1245)    ED Course  I have reviewed the triage vital signs and the nursing notes.  Pertinent labs & imaging results that were available during my care of the patient were reviewed by me and considered in my medical decision making (see chart for details).    MDM Rules/Calculators/A&P                         24 year old female presents with ongoing urinary symptoms despite being on 3 different antibiotics, has been following with urgent care.  Stopped all antibiotics yesterday due to side effects from medications.  And now having worsening urinary symptoms and flank pain, on arrival patient is well-appearing, afebrile with vitals otherwise normal.  Noted history of kidney stones.  Patient is sexually active one partner denies concern for STD, but given these persistent symptoms despite multiple antibiotics and negative urine cultures will perform pelvic exam.  Patient with some mild bilateral CVA tenderness but no anterior abdominal tenderness.  On pelvic patient has moderate amount of white discharge.  Friable cervix.  STD testing sent as well as basic labs, will also check renal stone study given flank pain persistent symptoms.  I have independently ordered, reviewed and interpreted all labs and imaging: CBC: No leukocytosis, normal hemoglobin CMP: Minimal hypokalemia 3.4 we'll have her increase potassium intake in her diet, no other significant electrolyte derangements, normal renal and liver function Urinalysis: Cloudy with moderate leuks, WBCs and RBCs present with many bacteria concerning for infection, culture sent Wet prep: Many WBCs present with no clue cells, trichomonas, or yeast.  Concerning for possible sexually transmitted infection, especially with friable cervix noted, GC and chlamydia sent.  Given patient's persistent symptoms this also raises some concern for  possible PID  Renal stone study with no evidence of renal stone or other obstructive uropathy, no perinephric stranding noted, reproductive organs also appear normal without enlargement, signs of cyst, or infection.  Given this reassuring CT and results as well as patient's reassuring lab work and exam I do not see need for pelvic ultrasound, I have low suspicion for TOA, ovarian cyst or torsion in the setting of normal CT.  Will treat for UTI as well as possible PID, patient given dose of Rocephin here in the ED, will treat with Keflex for UTI, and will also treat with 2-week course of doxycycline for possible PID, discussed this plan with patient as well as follow-up and return precautions.  We'll have her follow-up with her GYN closely.  She is aware she has STD testing pending and will need to notify her partner of any positive results so that he can be treated as well.  Return precautions discussed.  Discharged home in good condition.  Final Clinical Impression(s) / ED Diagnoses Final diagnoses:  Flank pain  Acute UTI  PID (acute pelvic inflammatory disease)    Rx / DC Orders ED Discharge Orders         Ordered    doxycycline (VIBRAMYCIN) 100 MG capsule  2 times daily        05/22/20 1322    cephALEXin (KEFLEX) 500 MG capsule  4 times daily        05/22/20 1322           Dartha Lodge, [place] 05/24/20 1032    Melene Plan, DO 05/24/20 1538

## 2020-05-22 NOTE — ED Notes (Signed)
Pt ambulatory to room from triage.  

## 2020-05-22 NOTE — ED Triage Notes (Signed)
Patient here from home reporting left sided flank pain 4-5 days ago. Denies issues urinating.

## 2020-05-23 LAB — GC/CHLAMYDIA PROBE AMP (~~LOC~~) NOT AT ARMC
Chlamydia: NEGATIVE
Comment: NEGATIVE
Comment: NORMAL
Neisseria Gonorrhea: NEGATIVE

## 2020-05-23 LAB — URINE CULTURE

## 2020-05-23 NOTE — Telephone Encounter (Signed)
Pt experiencing tendon soreness from Cipro please advise, I have requested an update from her.

## 2020-05-31 NOTE — Telephone Encounter (Signed)
Pt stopped Cipro states pain was temporarily gone but that it returned with increase in movement. Please advise how long this can last as well as what she should do about dizzy spells that also seem to be subsiding with stop of cipro

## 2020-06-01 ENCOUNTER — Telehealth: Payer: Self-pay | Admitting: Registered Nurse

## 2020-06-01 DIAGNOSIS — R300 Dysuria: Secondary | ICD-10-CM

## 2020-06-01 DIAGNOSIS — N390 Urinary tract infection, site not specified: Secondary | ICD-10-CM

## 2020-06-01 DIAGNOSIS — R109 Unspecified abdominal pain: Secondary | ICD-10-CM

## 2020-06-01 DIAGNOSIS — N920 Excessive and frequent menstruation with regular cycle: Secondary | ICD-10-CM

## 2020-06-01 NOTE — Telephone Encounter (Signed)
Patient called again to request referral for specialist about UTI. Patient finished antibiotics on Sunday and symptoms worsening. Patient having back pain. Please advise at [phone].

## 2020-06-01 NOTE — Telephone Encounter (Signed)
Pt is having ongoing UTI problems and is requesting a referral to Urology due to the frequent UTI's that she has been experiencing.   Pt finished her antibiotic and is not feeling well.   Please advise on recommendation and I have pended the urology referral please sign if appropriate.

## 2020-06-01 NOTE — Telephone Encounter (Signed)
Pt is calling regarding referrral for ongoing urinary tract infections / kidney infection      I was unable to see a referral request      Please advise

## 2020-06-01 NOTE — Telephone Encounter (Signed)
This message as been addressed in another encounter.

## 2020-06-02 NOTE — Telephone Encounter (Signed)
Pt is asking for a referral to specialist, either urology or nephrology? Which one to your prefer?

## 2020-06-02 NOTE — Telephone Encounter (Signed)
Patient is calling to check on the status of her refferal to radiology. [phone]

## 2020-06-03 ENCOUNTER — Other Ambulatory Visit: Payer: Self-pay

## 2020-06-03 NOTE — Telephone Encounter (Signed)
Urology would be ideal -  Thanks  Luan Pulling

## 2020-06-03 NOTE — Telephone Encounter (Signed)
Referral is for UROLOGY not Radiology. Pamela Montoya has been sent a message and were awaiting response

## 2020-06-07 ENCOUNTER — Ambulatory Visit (INDEPENDENT_AMBULATORY_CARE_PROVIDER_SITE_OTHER): Payer: Commercial Managed Care - PPO | Admitting: Registered Nurse

## 2020-06-07 ENCOUNTER — Other Ambulatory Visit: Payer: Self-pay

## 2020-06-07 ENCOUNTER — Encounter: Payer: Self-pay | Admitting: Registered Nurse

## 2020-06-07 ENCOUNTER — Other Ambulatory Visit: Payer: Self-pay | Admitting: Registered Nurse

## 2020-06-07 VITALS — BP 118/82 | HR 82 | Temp 98.3°F | Resp 18 | Ht 60.0 in | Wt 186.0 lb

## 2020-06-07 DIAGNOSIS — R3989 Other symptoms and signs involving the genitourinary system: Secondary | ICD-10-CM

## 2020-06-07 DIAGNOSIS — N390 Urinary tract infection, site not specified: Secondary | ICD-10-CM | POA: Diagnosis not present

## 2020-06-07 DIAGNOSIS — R102 Pelvic and perineal pain: Secondary | ICD-10-CM | POA: Diagnosis not present

## 2020-06-07 DIAGNOSIS — M549 Dorsalgia, unspecified: Secondary | ICD-10-CM

## 2020-06-07 LAB — POCT URINALYSIS DIP (CLINITEK)
Bilirubin, UA: NEGATIVE
Blood, UA: NEGATIVE
Glucose, UA: NEGATIVE mg/dL
Ketones, POC UA: NEGATIVE mg/dL
Nitrite, UA: NEGATIVE
POC PROTEIN,UA: NEGATIVE
Spec Grav, UA: <=1.005 — AB (ref 1.010–1.025)
Urobilinogen, UA: 0.2 E.U./dL
pH, UA: 7 (ref 5.0–8.0)

## 2020-06-07 MED ORDER — AMITRIPTYLINE HCL 25 MG PO TABS: 25.0000 mg | ORAL_TABLET | Freq: Every day | ORAL | 0 refills | Status: DC

## 2020-06-07 MED ORDER — AMITRIPTYLINE HCL 10 MG PO TABS: 10.0000 mg | ORAL_TABLET | Freq: Every day | ORAL | 0 refills | Status: DC

## 2020-06-07 NOTE — Telephone Encounter (Signed)
   Notes to clinic Pharmacy notes that pt stated 11/21 that she does not take this any longer.

## 2020-06-07 NOTE — Patient Instructions (Signed)
° ° ° °  If you have lab work done today you will be contacted with your lab results within the next 2 weeks.  If you have not heard from us then please contact us. The fastest way to get your results is to register for My Chart. ° ° °IF you received an x-ray today, you will receive an invoice from [facility]. Please contact [facility] at [phone] with questions or concerns regarding your invoice.  ° °IF you received labwork today, you will receive an invoice from [facility]. Please contact [facility] at [phone] with questions or concerns regarding your invoice.  ° °Our billing staff will not be able to assist you with questions regarding bills from these companies. ° °You will be contacted with the lab results as soon as they are available. The fastest way to get your results is to activate your My Chart account. Instructions are located on the last page of this paperwork. If you have not heard from us regarding the results in 2 weeks, please contact this office. °  ° ° ° °

## 2020-06-07 NOTE — Progress Notes (Signed)
Established Patient Office Visit  Subjective:  Patient ID: Pamela Montoya, female    DOB: Oct 11, 1995  Age: 24 y.o. MRN: [id number]  CC:  Chief Complaint  Patient presents with  . Urinary Tract Infection    Patient states she has had an uti 2 weeks ago and went to the urgent care she was told she had UTI and PID and patient states she is still having some back pain.    HPI Pamela Montoya presents for ongoing UTI symptoms  Unfortunately we have treated with abx a number of times and symptoms persist.  Was seen in ED and had renal stone CT which was negative Has been hydrating well and pursuing OTCs and nonpharm appropriately No fevers chills fatigue or systemic symptoms Denies gross hematuria  Would like to see urology   Past Medical History:  Diagnosis Date  . Asthma   . IBS (irritable bowel syndrome)   . Pseudotumor cerebri 2007    Past Surgical History:  Procedure Laterality Date  . Spinal tap    . WISDOM TOOTH EXTRACTION      Family History  Problem Relation Age of Onset  . Hypertension Father   . Lung cancer Maternal Grandmother   . Heart attack Maternal Grandfather   . Dementia Maternal Grandfather   . Colon cancer Paternal Grandmother   . Heart defect Sister     Social History   Socioeconomic History  . Marital status: Significant Other    Spouse name: Not on file  . Number of children: 0  . Years of education: Not on file  . Highest education level: Not on file  Occupational History  . Not on file  Tobacco Use  . Smoking status: Never Smoker  . Smokeless tobacco: Never Used  Vaping Use  . Vaping Use: Never used  Substance and Sexual Activity  . Alcohol use: Yes    Comment: occ  . Drug use: Never  . Sexual activity: Yes    Birth control/protection: Condom    Comment: 1st intercourse 24 yo-More than 5 partners  Other Topics Concern  . Not on file  Social History Narrative  . Not on file   Social Determinants of Health   Financial  Resource Strain:   . Difficulty of Paying Living Expenses: Not on file  Food Insecurity:   . Worried About Programme researcher, broadcasting/film/video in the Last Year: Not on file  . Ran Out of Food in the Last Year: Not on file  Transportation Needs:   . Lack of Transportation (Medical): Not on file  . Lack of Transportation (Non-Medical): Not on file  Physical Activity:   . Days of Exercise per Week: Not on file  . Minutes of Exercise per Session: Not on file  Stress:   . Feeling of Stress : Not on file  Social Connections:   . Frequency of Communication with Friends and Family: Not on file  . Frequency of Social Gatherings with Friends and Family: Not on file  . Attends Religious Services: Not on file  . Active Member of Clubs or Organizations: Not on file  . Attends Banker Meetings: Not on file  . Marital Status: Not on file  Intimate Partner Violence:   . Fear of Current or Ex-Partner: Not on file  . Emotionally Abused: Not on file  . Physically Abused: Not on file  . Sexually Abused: Not on file    Outpatient Medications Prior to Visit  Medication Sig Dispense Refill  .  albuterol (VENTOLIN HFA) 108 (90 Base) MCG/ACT inhaler Inhale 2 puffs into the lungs every 6 (six) hours as needed for wheezing or shortness of breath. 18 g 10  . ibuprofen (ADVIL) 200 MG tablet Take 200 mg by mouth every 6 (six) hours as needed for moderate pain.    Marland Kitchen Phenazopyridine HCl (AZO DINE PO) Take 1 tablet by mouth daily as needed (For blader).     . cephALEXin (KEFLEX) 500 MG capsule Take 1 capsule (500 mg total) by mouth 4 (four) times daily. (Patient not taking: Reported on 06/07/2020) 28 capsule 0  . meloxicam (MOBIC) 7.5 MG tablet Take 1 tablet (7.5 mg total) by mouth daily. (Patient not taking: Reported on 05/22/2020) 30 tablet 0  . sulfamethoxazole-trimethoprim (BACTRIM DS) 800-160 MG tablet Take 1 tablet by mouth 2 (two) times daily. (Patient not taking: Reported on 05/22/2020) 6 tablet 0   No  facility-administered medications prior to visit.    Allergies  Allergen Reactions  . Ciprofloxacin     pain  . Meloxicam     Dizzy    ROS Review of Systems  Constitutional: Negative.   HENT: Negative.   Eyes: Negative.   Respiratory: Negative.   Cardiovascular: Negative.   Gastrointestinal: Negative.   Genitourinary: Positive for dysuria and pelvic pain.  Musculoskeletal: Negative.   Skin: Negative.   Neurological: Negative.   Psychiatric/Behavioral: Negative.       Objective:    Physical Exam Vitals and nursing note reviewed.  Constitutional:      General: She is not in acute distress.    Appearance: Normal appearance. She is normal weight. She is not ill-appearing, toxic-appearing or diaphoretic.  Cardiovascular:     Rate and Rhythm: Normal rate and regular rhythm.     Heart sounds: Normal heart sounds. No murmur heard.  No friction rub. No gallop.   Pulmonary:     Effort: Pulmonary effort is normal. No respiratory distress.     Breath sounds: Normal breath sounds. No stridor. No wheezing, rhonchi or rales.  Chest:     Chest wall: No tenderness.  Skin:    General: Skin is warm and dry.  Neurological:     General: No focal deficit present.     Mental Status: She is alert and oriented to person, place, and time. Mental status is at baseline.  Psychiatric:        Mood and Affect: Mood normal.        Behavior: Behavior normal.        Thought Content: Thought content normal.        Judgment: Judgment normal.     BP 118/82   Pulse 82   Temp 98.3 F (36.8 C) (Temporal)   Resp 18   Ht 5' (1.524 m)   Wt 186 lb (84.4 kg)   SpO2 96%   BMI 36.33 kg/m  Wt Readings from Last 3 Encounters:  06/07/20 186 lb (84.4 kg)  05/11/20 182 lb 3.2 oz (82.6 kg)  01/01/20 180 lb 3.2 oz (81.7 kg)     There are no preventive care reminders to display for this patient.  There are no preventive care reminders to display for this patient.  Lab Results  Component Value  Date   TSH 2.080 07/22/2019   Lab Results  Component Value Date   WBC 8.7 05/22/2020   HGB 12.2 05/22/2020   HCT 37.7 05/22/2020   MCV 86.1 05/22/2020   PLT 375 05/22/2020   Lab Results  Component Value  Date   NA 139 05/22/2020   K 3.4 (L) 05/22/2020   CO2 23 05/22/2020   GLUCOSE 95 05/22/2020   BUN 12 05/22/2020   CREATININE 0.58 05/22/2020   BILITOT 0.3 05/22/2020   ALKPHOS 86 05/22/2020   AST 21 05/22/2020   ALT 17 05/22/2020   PROT 8.3 (H) 05/22/2020   ALBUMIN 4.6 05/22/2020   CALCIUM 9.9 05/22/2020   ANIONGAP 11 05/22/2020   Lab Results  Component Value Date   CHOL 140 07/22/2019   Lab Results  Component Value Date   HDL 30 (L) 07/22/2019   Lab Results  Component Value Date   LDLCALC 78 07/22/2019   Lab Results  Component Value Date   TRIG 187 (H) 07/22/2019   Lab Results  Component Value Date   CHOLHDL 4.7 (H) 07/22/2019   No results found for: HGBA1C    Assessment & Plan:   Problem List Items Addressed This Visit    None    Visit Diagnoses    Frequent UTI    -  Primary   Relevant Orders   POCT URINALYSIS DIP (CLINITEK) (Completed)   Urine Culture   Basic Metabolic Panel   Urinalysis, microscopic only   CBC with Differential   Ambulatory referral to Urology   Suprapubic pain       Relevant Orders   Urine Culture   Basic Metabolic Panel   Urinalysis, microscopic only   CBC with Differential   Ambulatory referral to Urology   Bladder pain       Relevant Medications   amitriptyline (ELAVIL) 10 MG tablet   amitriptyline (ELAVIL) 25 MG tablet   Other Relevant Orders   Ambulatory referral to Urology      Meds ordered this encounter  Medications  . amitriptyline (ELAVIL) 10 MG tablet    Sig: Take 1 tablet (10 mg total) by mouth at bedtime.    Dispense:  7 tablet    Refill:  0    Order Specific Question:   Supervising Provider    Answer:   Neva Seat, JEFFREY R [[id number]]  . amitriptyline (ELAVIL) 25 MG tablet    Sig: Take 1 tablet (25  mg total) by mouth at bedtime.    Dispense:  30 tablet    Refill:  0    Order Specific Question:   Supervising Provider    Answer:   Neva Seat, JEFFREY R [[id number]]    Follow-up: No follow-ups on file.   PLAN  Reassured by negative CT at ER  Will draw cbc and cmp to check for changes  poct ua on site shows some leuks but not definitive for UTI  Send culture and microscopic urinalysis  Refer to urology - even if this is recurrent UTI, frequency is concerning. Would favor bladder pain syndrome at this time.   Will start low dose amitriptyline 10mg  PO qhs - can increase to 25mg  po qhs after one week  Patient encouraged to call clinic with any questions, comments, or concerns.  , NP

## 2020-06-08 ENCOUNTER — Encounter: Payer: Self-pay | Admitting: Registered Nurse

## 2020-06-08 LAB — CBC WITH DIFFERENTIAL/PLATELET
Basophils Absolute: 0.1 10*3/uL (ref 0.0–0.2)
Basos: 1 %
EOS (ABSOLUTE): 0.1 10*3/uL (ref 0.0–0.4)
Eos: 1 %
Hematocrit: 36.9 % (ref 34.0–46.6)
Hemoglobin: 12.2 g/dL (ref 11.1–15.9)
Immature Grans (Abs): 0 10*3/uL (ref 0.0–0.1)
Immature Granulocytes: 0 %
Lymphocytes Absolute: 2.8 10*3/uL (ref 0.7–3.1)
Lymphs: 37 %
MCH: 27.6 pg (ref 26.6–33.0)
MCHC: 33.1 g/dL (ref 31.5–35.7)
MCV: 84 fL (ref 79–97)
Monocytes Absolute: 0.5 10*3/uL (ref 0.1–0.9)
Monocytes: 6 %
Neutrophils Absolute: 4.3 10*3/uL (ref 1.4–7.0)
Neutrophils: 55 %
Platelets: 396 10*3/uL (ref 150–450)
RBC: 4.42 x10E6/uL (ref 3.77–5.28)
RDW: 13.8 % (ref 11.7–15.4)
WBC: 7.8 10*3/uL (ref 3.4–10.8)

## 2020-06-08 LAB — BASIC METABOLIC PANEL
BUN/Creatinine Ratio: 19 (ref 9–23)
BUN: 10 mg/dL (ref 6–20)
CO2: 20 mmol/L (ref 20–29)
Calcium: 9.9 mg/dL (ref 8.7–10.2)
Chloride: 103 mmol/L (ref 96–106)
Creatinine, Ser: 0.52 mg/dL — ABNORMAL LOW (ref 0.57–1.00)
GFR calc Af Amer: 155 mL/min/{1.73_m2} (ref >59)
GFR calc non Af Amer: 134 mL/min/{1.73_m2} (ref >59)
Glucose: 75 mg/dL (ref 65–99)
Potassium: 4.6 mmol/L (ref 3.5–5.2)
Sodium: 138 mmol/L (ref 134–144)

## 2020-06-08 LAB — URINALYSIS, MICROSCOPIC ONLY
Bacteria, UA: NONE SEEN
Casts: NONE SEEN /lpf
RBC, Urine: NONE SEEN /hpf (ref 0–2)

## 2020-06-10 LAB — URINE CULTURE

## 2020-06-11 ENCOUNTER — Other Ambulatory Visit: Payer: Self-pay | Admitting: Registered Nurse

## 2020-06-11 DIAGNOSIS — R3989 Other symptoms and signs involving the genitourinary system: Secondary | ICD-10-CM

## 2020-06-21 ENCOUNTER — Other Ambulatory Visit: Payer: Self-pay

## 2020-06-21 ENCOUNTER — Encounter: Payer: Self-pay | Admitting: Nurse Practitioner

## 2020-06-21 ENCOUNTER — Ambulatory Visit (INDEPENDENT_AMBULATORY_CARE_PROVIDER_SITE_OTHER): Payer: Commercial Managed Care - PPO | Admitting: Nurse Practitioner

## 2020-06-21 ENCOUNTER — Other Ambulatory Visit (HOSPITAL_COMMUNITY)
Admission: RE | Admit: 2020-06-21 | Discharge: 2020-06-21 | Disposition: A | Discharge status: Home / Self Care | Payer: Commercial Managed Care - PPO | Source: Ambulatory Visit | Attending: Obstetrics and Gynecology | Admitting: Obstetrics and Gynecology

## 2020-06-21 VITALS — BP 110/68 | HR 70 | Resp 16 | Ht 60.75 in | Wt 187.0 lb

## 2020-06-21 DIAGNOSIS — Z01419 Encounter for gynecological examination (general) (routine) without abnormal findings: Secondary | ICD-10-CM | POA: Diagnosis not present

## 2020-06-21 NOTE — Patient Instructions (Addendum)
We can look into SKYLA IUD, you may want tto run this by your neurologist. Another barrier method that may be an option for you is a CAYA diaphragm.    Health Maintenance, Female Adopting a healthy lifestyle and getting preventive care are important in promoting health and wellness. Ask your health care provider about:  The right schedule for you to have regular tests and exams.  Things you can do on your own to prevent diseases and keep yourself healthy. What should I know about diet, weight, and exercise? Eat a healthy diet   Eat a diet that includes plenty of vegetables, fruits, low-fat dairy products, and lean protein.  Do not eat a lot of foods that are high in solid fats, added sugars, or sodium. Maintain a healthy weight Body mass index (BMI) is used to identify weight problems. It estimates body fat based on height and weight. Your health care provider can help determine your BMI and help you achieve or maintain a healthy weight. Get regular exercise Get regular exercise. This is one of the most important things you can do for your health. Most adults should:  Exercise for at least 150 minutes each week. The exercise should increase your heart rate and make you sweat (moderate-intensity exercise).  Do strengthening exercises at least twice a week. This is in addition to the moderate-intensity exercise.  Spend less time sitting. Even light physical activity can be beneficial. Watch cholesterol and blood lipids Have your blood tested for lipids and cholesterol at 24 years of age, then have this test every 5 years. Have your cholesterol levels checked more often if:  Your lipid or cholesterol levels are high.  You are older than 24 years of age.  You are at high risk for heart disease. What should I know about cancer screening? Depending on your health history and family history, you may need to have cancer screening at various ages. This may include screening for:  Breast  cancer.  Cervical cancer.  Colorectal cancer.  Skin cancer.  Lung cancer. What should I know about heart disease, diabetes, and high blood pressure? Blood pressure and heart disease  High blood pressure causes heart disease and increases the risk of stroke. This is more likely to develop in people who have high blood pressure readings, are of African descent, or are overweight.  Have your blood pressure checked: ? Every 3-5 years if you are 34-55 years of age. ? Every year if you are 16 years old or older. Diabetes Have regular diabetes screenings. This checks your fasting blood sugar level. Have the screening done:  Once every three years after age 93 if you are at a normal weight and have a low risk for diabetes.  More often and at a younger age if you are overweight or have a high risk for diabetes. What should I know about preventing infection? Hepatitis B If you have a higher risk for hepatitis B, you should be screened for this virus. Talk with your health care provider to find out if you are at risk for hepatitis B infection. Hepatitis C Testing is recommended for:  Everyone born from [birth year] through 1965.  Anyone with known risk factors for hepatitis C. Sexually transmitted infections (STIs)  Get screened for STIs, including gonorrhea and chlamydia, if: ? You are sexually active and are younger than 24 years of age. ? You are older than 24 years of age and your health care provider tells you that you are at risk  for this type of infection. ? Your sexual activity has changed since you were last screened, and you are at increased risk for chlamydia or gonorrhea. Ask your health care provider if you are at risk.  Ask your health care provider about whether you are at high risk for HIV. Your health care provider may recommend a prescription medicine to help prevent HIV infection. If you choose to take medicine to prevent HIV, you should first get tested for HIV. You should  then be tested every 3 months for as long as you are taking the medicine. Pregnancy  If you are about to stop having your period (premenopausal) and you may become pregnant, seek counseling before you get pregnant.  Take 400 to 800 micrograms (mcg) of folic acid every day if you become pregnant.  Ask for birth control (contraception) if you want to prevent pregnancy. Osteoporosis and menopause Osteoporosis is a disease in which the bones lose minerals and strength with aging. This can result in bone fractures. If you are 65 years old or older, or if you are at risk for osteoporosis and fractures, ask your health care provider if you should:  Be screened for bone loss.  Take a calcium or vitamin D supplement to lower your risk of fractures.  Be given hormone replacement therapy (HRT) to treat symptoms of menopause. Follow these instructions at home: Lifestyle  Do not use any products that contain nicotine or tobacco, such as cigarettes, e-cigarettes, and chewing tobacco. If you need help quitting, ask your health care provider.  Do not use street drugs.  Do not share needles.  Ask your health care provider for help if you need support or information about quitting drugs. Alcohol use  Do not drink alcohol if: ? Your health care provider tells you not to drink. ? You are pregnant, may be pregnant, or are planning to become pregnant.  If you drink alcohol: ? Limit how much you use to 0-1 drink a day. ? Limit intake if you are breastfeeding.  Be aware of how much alcohol is in your drink. In the [place] one drink equals one 12 oz bottle of beer (355 mL), one 5 oz glass of wine (148 mL), or one 1 oz glass of hard liquor (44 mL). General instructions  Schedule regular health, dental, and eye exams.  Stay current with your vaccines.  Tell your health care provider if: ? You often feel depressed. ? You have ever been abused or do not feel safe at home. Summary  Adopting a  healthy lifestyle and getting preventive care are important in promoting health and wellness.  Follow your health care provider's instructions about healthy diet, exercising, and getting tested or screened for diseases.  Follow your health care provider's instructions on monitoring your cholesterol and blood pressure. This information is not intended to replace advice given to you by your health care provider. Make sure you discuss any questions you have with your health care provider. Document Revised: 06/11/2018 Document Reviewed: 06/11/2018 Elsevier Patient Education  2020 [employer] Inc[employer]  

## 2020-06-21 NOTE — Progress Notes (Signed)
24 y.o. G0P0 Significant Other White or Caucasian female here for annual exam.    Is here to follow up from a hospital visit when she was told she had possible UTI or pelvic infection. Urine culture was negative, GC/CT neg (done 05/22/2020, results in labs)  Followed up with PCP who repeated urine culture-neg She was told to go to GYN for follow up.  Today all symptoms resolved except lower back ache (took IV antibiotic in hospital and two oral antibiotics)   Reports that her periods are regular but heavy. Would like to make them lighter but can not take birth control hormones r/t pseuduotumor cerebri. Last time she tried birth control pills has too much nausea, visual changes and frequent headaches. Was diagnosed with pseudotumor cerebri age 80. Recently had neurology follow up and everything is stable.  Has been with same sexual partner x 4 years, is engaged to be married. Both want to get through masters programs first. 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 . Period Duration (Days): 5-6 Period Pattern: Regular Menstrual Flow: Heavy (heavy 4 days) Menstrual Control: Maxi pad Dysmenorrhea: (!) Moderate (severe for only 2 days, moderate for the rest) Dysmenorrhea Symptoms: Cramping,Nausea,Diarrhea,Headache  Patient's last menstrual period was 06/18/2020 (exact date).          Sexually active: Yes.    The current method of family planning is condoms all the time.    Exercising: No.  exercise Smoker:  no  Health Maintenance: Pap:  12/19 neg per patient History of abnormal Pap:  no MMG:  none Colonoscopy:  Maybe 59yrs ago BMD:   none TDaP:  2018 Gardasil:   completed Covid-19: pfizer Hep C testing: neg 2021   reports that she has never smoked. She has never used smokeless tobacco. She reports previous alcohol use. She reports that she does not use drugs.  Past Medical History:  Diagnosis Date   Anxiety    Asthma    IBS (irritable bowel syndrome)     Migraines    Pseudotumor cerebri 2007    Past Surgical History:  Procedure Laterality Date   Spinal tap     WISDOM TOOTH EXTRACTION      Current Outpatient Medications  Medication Sig Dispense Refill   albuterol (VENTOLIN HFA) 108 (90 Base) MCG/ACT inhaler Inhale 2 puffs into the lungs every 6 (six) hours as needed for wheezing or shortness of breath. 18 g 10   ibuprofen (ADVIL) 200 MG tablet Take 200 mg by mouth every 6 (six) hours as needed for moderate pain.     UNABLE TO FIND Lavender supplement     No current facility-administered medications for this visit.    Family History  Problem Relation Age of Onset   Hyperlipidemia Mother    Hypertension Father    Thyroid disease Father    Cancer Maternal Grandmother    Heart attack Maternal Grandfather    Dementia Maternal Grandfather    Colon cancer Paternal Grandmother    Lung cancer Paternal Grandmother    Heart defect Sister     Review of Systems  Constitutional: Negative.   HENT: Negative.   Eyes: Negative.   Respiratory: Negative.   Cardiovascular: Negative.   Gastrointestinal: Negative.   Endocrine: Negative.   Genitourinary:       Tenderness in back  Musculoskeletal: Negative.   Skin: Negative.   Allergic/Immunologic: Negative.   Neurological: Negative.   Hematological: Negative.   Psychiatric/Behavioral: Negative.     Exam:  BP 110/68    Pulse 70    Resp 16    Ht 5' 0.75" (1.543 m)    Wt 187 lb (84.8 kg)    LMP 06/18/2020 (Exact Date)    BMI 35.62 kg/m   Height: 5' 0.75" (154.3 cm)  General appearance: alert, cooperative and appears stated age, no acute distress Head: Normocephalic, without obvious abnormality Neck: no adenopathy, thyroid normal to inspection and palpation Lungs: clear to auscultation bilaterally Breasts: normal appearance, no masses or tenderness, dense and glandular bilaterally Heart: regular rate and rhythm Abdomen: soft, non-tender; no masses,  no  organomegaly Extremities: extremities normal, no edema Skin: No rashes or lesions Lymph nodes: Cervical, supraclavicular, and axillary nodes normal. No abnormal inguinal nodes palpated Neurologic: Grossly normal Neg CVAT   Pelvic: External genitalia:  no lesions              Urethra:  normal appearing urethra with no masses, tenderness or lesions              Bartholins and Skenes: normal                 Vagina: normal appearing vagina, appropriate for age, normal appearing discharge, no lesions              Cervix: neg cervical motion tenderness, no visible lesions             Bimanual Exam:   Uterus:  normal size, contour, position, consistency, mobility, non-tender              Adnexa: no mass, fullness, tenderness, anterverted                 Joy, CMA Chaperone was present for exam.  A:  Well Woman with normal exam   Pseudotumor Cerebi   P:   Pap :collection today  Discussed no evidence of pelvic infection, normal exam  Discussed possible IUD for management of bleeding and birth control. Pt will look into this and discuss with neurologist. Will contact office if desires to schedule insertion.

## 2020-06-28 ENCOUNTER — Encounter: Payer: Self-pay | Admitting: Registered Nurse

## 2020-06-28 ENCOUNTER — Ambulatory Visit (INDEPENDENT_AMBULATORY_CARE_PROVIDER_SITE_OTHER): Payer: Commercial Managed Care - PPO | Admitting: Registered Nurse

## 2020-06-28 ENCOUNTER — Other Ambulatory Visit: Payer: Self-pay

## 2020-06-28 VITALS — BP 110/74 | HR 98 | Temp 98.4°F | Resp 18 | Ht 60.0 in | Wt 188.8 lb

## 2020-06-28 DIAGNOSIS — R202 Paresthesia of skin: Secondary | ICD-10-CM | POA: Diagnosis not present

## 2020-06-28 DIAGNOSIS — Z832 Family history of diseases of the blood and blood-forming organs and certain disorders involving the immune mechanism: Secondary | ICD-10-CM | POA: Diagnosis not present

## 2020-06-28 DIAGNOSIS — Z8349 Family history of other endocrine, nutritional and metabolic diseases: Secondary | ICD-10-CM

## 2020-06-28 DIAGNOSIS — R42 Dizziness and giddiness: Secondary | ICD-10-CM | POA: Diagnosis not present

## 2020-06-28 LAB — CYTOLOGY - PAP: Diagnosis: NEGATIVE

## 2020-06-28 MED ORDER — MECLIZINE HCL 12.5 MG PO TABS: 12.5000 mg | ORAL_TABLET | Freq: Three times a day (TID) | ORAL | 0 refills | Status: DC | PRN

## 2020-06-28 NOTE — Progress Notes (Signed)
Established Patient Office Visit  Subjective:  Patient ID: Pamela Montoya, female    DOB: 1995-10-05  Age: 24 y.o. MRN: [id number]  CC:  Chief Complaint  Patient presents with  . Dizziness    Patient states she has been feeling a little dizzy the other day while walking and it usually only happens when she is sitting. Her mom stated she had a weak pulse at the time.    HPI [name] presents for dizzy spells  Have happened occasionally through past - usually 1-2 times per year But now happening about monthly Usually when sitting and she can change position or walk around to resolve Has attributed it to low blood sugar, dehydration or her pseudotumor cerebri in the past  Recent episode happened on day with much travel, a lot of shopping, and little hydration and other po intake. Was standing in a store and had to find a seat and gather herself. Episode lasted a few minutes but as is typical for these she felt "off" for the rest of the day. Symptoms do not carry over into the following day  Episodes have dizziness, intense nausea without vomiting, lightheadedness, loss of balance, and a floating feeling. No association with her headaches, but those seem to be more common. Seems to have tinnitus but this is separate from her pulsatile tinnitus from pseudotumor cerebri. Does get blurred/shifting vision when this happens but does not lose vision. No clear pattern in regards to po intake, menstrual cycle, diet choices, or other.  Further, patient notes: strong family history of autoimmune processes, including both sisters with pseudotumor cerebri, one sister with POTS, the other with hashimoto's thyroiditis, father with unspecified hypothyroidism, mgf with congenital heart defects, mother with SLE.  Does report frequent dry mouth, frequent canker sores and other lesions, no hx of hsv.  Extremely sensitive to heat - worsening in past 1-2 years.  Pseudotumor cerebri has always been R>L for  visual effects.  Does note during episodes that she develops somewhat of a cognitive deficit as far as she can tell - stutters, cannot communicate, but can move under her own power, has some coordination despite dizziness, does not seem to be seizure activity. Back pain is ongoing, we have attributed this to UTI in recent visits but last culture clear, has seen gyn who has low suspicion for reproductive process contributing to these events. Back pain is aching at tailbone. Worse when sitting for work all day. Better with some activity. Last imaging of brain was around 5-6 years ago - outside of [facility] facilities  Unclear if related - does note some numbness and tingling in feet at times, otherwise sometimes vague sensory disturbance at random places on body, no clear trigger, has not happened before. Transient, only lasts minutes. No rash or redness.   neuroophthalmology team is at [facility] - Dr. Leodis Liverpool Has not seen general neurology since initial dx.  No notable cv history, denies chest pain, shob, doe, dependent edema, and claudication  Past Medical History:  Diagnosis Date  . Anxiety   . Asthma   . IBS (irritable bowel syndrome)   . Migraines   . Pseudotumor cerebri 2007    Past Surgical History:  Procedure Laterality Date  . Spinal tap    . WISDOM TOOTH EXTRACTION      Family History  Problem Relation Age of Onset  . Hyperlipidemia Mother   . Hypertension Father   . Thyroid disease Father   . Cancer Maternal Grandmother   .  Heart attack Maternal Grandfather   . Dementia Maternal Grandfather   . Colon cancer Paternal Grandmother   . Lung cancer Paternal Grandmother   . Heart defect Sister     Social History   Socioeconomic History  . Marital status: Significant Other    Spouse name: Not on file  . Number of children: 0  . Years of education: Not on file  . Highest education level: Not on file  Occupational History  . Not on file  Tobacco Use  . Smoking status: Never  Smoker  . Smokeless tobacco: Never Used  Vaping Use  . Vaping Use: Never used  Substance and Sexual Activity  . Alcohol use: Not Currently  . Drug use: Never  . Sexual activity: Yes    Partners: Male    Birth control/protection: Condom  Other Topics Concern  . Not on file  Social History Narrative  . Not on file   Social Determinants of Health   Financial Resource Strain: Not on file  Food Insecurity: Not on file  Transportation Needs: Not on file  Physical Activity: Not on file  Stress: Not on file  Social Connections: Not on file  Intimate Partner Violence: Not on file    Outpatient Medications Prior to Visit  Medication Sig Dispense Refill  . albuterol (VENTOLIN HFA) 108 (90 Base) MCG/ACT inhaler Inhale 2 puffs into the lungs every 6 (six) hours as needed for wheezing or shortness of breath. 18 g 10  . ibuprofen (ADVIL) 200 MG tablet Take 200 mg by mouth every 6 (six) hours as needed for moderate pain.    Marland Kitchen UNABLE TO FIND Lavender supplement     No facility-administered medications prior to visit.    Allergies  Allergen Reactions  . Ciprofloxacin     pain  . Meloxicam     Dizzy    ROS Review of Systems Per hpi     Objective:    Physical Exam Vitals and nursing note reviewed.  Constitutional:      General: She is not in acute distress.    Appearance: Normal appearance. She is normal weight. She is not ill-appearing, toxic-appearing or diaphoretic.  Cardiovascular:     Rate and Rhythm: Normal rate and regular rhythm.     Heart sounds: Normal heart sounds. No murmur heard. No friction rub. No gallop.   Pulmonary:     Effort: Pulmonary effort is normal. No respiratory distress.     Breath sounds: Normal breath sounds. No stridor. No wheezing, rhonchi or rales.  Chest:     Chest wall: No tenderness.  Skin:    General: Skin is warm and dry.  Neurological:     General: No focal deficit present.     Mental Status: She is alert and oriented to person,  place, and time. Mental status is at baseline.  Psychiatric:        Mood and Affect: Mood normal.        Behavior: Behavior normal.        Thought Content: Thought content normal.        Judgment: Judgment normal.     BP 110/74   Pulse 98   Temp 98.4 F (36.9 C) (Temporal)   Resp 18   Ht 5' (1.524 m)   Wt 188 lb 12.8 oz (85.6 kg)   LMP 06/18/2020 (Exact Date)   SpO2 98%   BMI 36.87 kg/m  Wt Readings from Last 3 Encounters:  06/28/20 188 lb 12.8 oz (85.6 kg)  06/21/20 187 lb (84.8 kg)  06/07/20 186 lb (84.4 kg)     There are no preventive care reminders to display for this patient.  There are no preventive care reminders to display for this patient.  Lab Results  Component Value Date   TSH 2.080 07/22/2019   Lab Results  Component Value Date   WBC 7.8 06/07/2020   HGB 12.2 06/07/2020   HCT 36.9 06/07/2020   MCV 84 06/07/2020   PLT 396 06/07/2020   Lab Results  Component Value Date   NA 138 06/07/2020   K 4.6 06/07/2020   CO2 20 06/07/2020   GLUCOSE 75 06/07/2020   BUN 10 06/07/2020   CREATININE 0.52 (L) 06/07/2020   BILITOT 0.3 05/22/2020   ALKPHOS 86 05/22/2020   AST 21 05/22/2020   ALT 17 05/22/2020   PROT 8.3 (H) 05/22/2020   ALBUMIN 4.6 05/22/2020   CALCIUM 9.9 06/07/2020   ANIONGAP 11 05/22/2020   Lab Results  Component Value Date   CHOL 140 07/22/2019   Lab Results  Component Value Date   HDL 30 (L) 07/22/2019   Lab Results  Component Value Date   LDLCALC 78 07/22/2019   Lab Results  Component Value Date   TRIG 187 (H) 07/22/2019   Lab Results  Component Value Date   CHOLHDL 4.7 (H) 07/22/2019   No results found for: HGBA1C    Assessment & Plan:   Problem List Items Addressed This Visit   None   Visit Diagnoses    Dizziness and giddiness    -  Primary   Relevant Medications   meclizine (ANTIVERT) 12.5 MG tablet   Other Relevant Orders   EKG 12-Lead (Completed)   CBC   Folate   Vitamin B12   Copper, Serum   Iron,  TIBC and Ferritin Panel   Family history of autoimmune disorder       Relevant Orders   ANA w/Reflex   C-reactive protein   Sedimentation Rate   Family history of thyroiditis       Relevant Orders   Thyroid Panel With TSH   Paresthesia       Relevant Orders   Vitamin D, 25-hydroxy      Meds ordered this encounter  Medications  . meclizine (ANTIVERT) 12.5 MG tablet    Sig: Take 1 tablet (12.5 mg total) by mouth 3 (three) times daily as needed for dizziness.    Dispense:  30 tablet    Refill:  0    Order Specific Question:   Supervising Provider    Answer:   Carlota Raspberry, JEFFREY R [[id number]]    Follow-up: No follow-ups on file.   PLAN  Symptoms are vague but concerning for further etiology. Differentials can include more benign occurrences like dehydration or BPPV, but some concern for neurodegenerative changes or mass effect, or further chronic condition. Will start with blood work today.   Given strong fam hx of autoimmunity and nonspecific symptoms, will start with thyroid panel, ana w reflex, esr, crp. Given the overall trouble with diagnosis of neurological conditions in pediatrics, will take fresh approach for neurological conditions for coincidence or in case pseudotumor is secondary to other process  Close follow up  Short threshold for neuro imaging and referral  ekg compared to previous study on 12/07/19. Reveals no acute processes. No acute changes from previous study. Reassuring exam.  Patient encouraged to call clinic with any questions, comments, or concerns.  Maximiano Coss, NP

## 2020-06-28 NOTE — Patient Instructions (Signed)
° ° ° °  If you have lab work done today you will be contacted with your lab results within the next 2 weeks.  If you have not heard from us then please contact us. The fastest way to get your results is to register for My Chart. ° ° °IF you received an x-ray today, you will receive an invoice from [facility]. Please contact [facility] at [phone] with questions or concerns regarding your invoice.  ° °IF you received labwork today, you will receive an invoice from [facility]. Please contact [facility] at [phone] with questions or concerns regarding your invoice.  ° °Our billing staff will not be able to assist you with questions regarding bills from these companies. ° °You will be contacted with the lab results as soon as they are available. The fastest way to get your results is to activate your My Chart account. Instructions are located on the last page of this paperwork. If you have not heard from us regarding the results in 2 weeks, please contact this office. °  ° ° ° °

## 2020-06-29 ENCOUNTER — Other Ambulatory Visit: Payer: Self-pay | Admitting: Registered Nurse

## 2020-06-29 DIAGNOSIS — E559 Vitamin D deficiency, unspecified: Secondary | ICD-10-CM | POA: Insufficient documentation

## 2020-06-29 MED ORDER — VITAMIN D (ERGOCALCIFEROL) 1.25 MG (50000 UNIT) PO CAPS: 50000.0000 [IU] | ORAL_CAPSULE | ORAL | 0 refills | Status: DC

## 2020-06-30 LAB — CBC
Hematocrit: 38.4 % (ref 34.0–46.6)
Hemoglobin: 12.5 g/dL (ref 11.1–15.9)
MCH: 27.5 pg (ref 26.6–33.0)
MCHC: 32.6 g/dL (ref 31.5–35.7)
MCV: 85 fL (ref 79–97)
Platelets: 384 10*3/uL (ref 150–450)
RBC: 4.54 x10E6/uL (ref 3.77–5.28)
RDW: 13.4 % (ref 11.7–15.4)
WBC: 7.6 10*3/uL (ref 3.4–10.8)

## 2020-06-30 LAB — IRON,TIBC AND FERRITIN PANEL
Ferritin: 8 ng/mL — ABNORMAL LOW (ref 15–150)
Iron Saturation: 10 % — ABNORMAL LOW (ref 15–55)
Iron: 39 ug/dL (ref 27–159)
Total Iron Binding Capacity: 376 ug/dL (ref 250–450)
UIBC: 337 ug/dL (ref 131–425)

## 2020-06-30 LAB — THYROID PANEL WITH TSH
Free Thyroxine Index: 1.9 (ref 1.2–4.9)
T3 Uptake Ratio: 27 % (ref 24–39)
T4, Total: 7.2 ug/dL (ref 4.5–12.0)
TSH: 2.59 u[IU]/mL (ref 0.450–4.500)

## 2020-06-30 LAB — FOLATE: Folate: 16.1 ng/mL (ref >3.0)

## 2020-06-30 LAB — ANA W/REFLEX: Anti Nuclear Antibody (ANA): NEGATIVE

## 2020-06-30 LAB — SEDIMENTATION RATE: Sed Rate: 20 mm/hr (ref 0–32)

## 2020-06-30 LAB — VITAMIN B12: Vitamin B-12: 394 pg/mL (ref 232–1245)

## 2020-06-30 LAB — C-REACTIVE PROTEIN: CRP: 2 mg/L (ref 0–10)

## 2020-06-30 LAB — COPPER, SERUM: Copper: 111 ug/dL (ref 80–158)

## 2020-06-30 LAB — VITAMIN D 25 HYDROXY (VIT D DEFICIENCY, FRACTURES): Vit D, 25-Hydroxy: 13.9 ng/mL — ABNORMAL LOW (ref 30.0–100.0)

## 2020-07-05 ENCOUNTER — Encounter: Payer: Self-pay | Admitting: Registered Nurse

## 2020-07-09 ENCOUNTER — Encounter: Payer: Self-pay | Admitting: Registered Nurse

## 2020-07-10 ENCOUNTER — Other Ambulatory Visit: Payer: Self-pay | Admitting: Registered Nurse

## 2020-07-10 DIAGNOSIS — R3989 Other symptoms and signs involving the genitourinary system: Secondary | ICD-10-CM

## 2020-07-13 ENCOUNTER — Ambulatory Visit: Payer: Commercial Managed Care - PPO | Admitting: Obstetrics and Gynecology

## 2020-07-13 NOTE — Telephone Encounter (Signed)
Not at this time!   Thanks,  Luan Pulling

## 2020-07-20 ENCOUNTER — Other Ambulatory Visit: Payer: Self-pay | Admitting: Registered Nurse

## 2020-07-20 DIAGNOSIS — E559 Vitamin D deficiency, unspecified: Secondary | ICD-10-CM

## 2020-07-21 ENCOUNTER — Telehealth: Payer: Self-pay | Admitting: Registered Nurse

## 2020-07-21 MED ORDER — VITAMIN D (ERGOCALCIFEROL) 1.25 MG (50000 UNIT) PO CAPS: 50000.0000 [IU] | ORAL_CAPSULE | ORAL | 0 refills | Status: DC

## 2020-07-21 NOTE — Telephone Encounter (Signed)
called pt LVM to call us back that appt had been cancelled as provider not available 01/202021

## 2020-07-22 ENCOUNTER — Ambulatory Visit: Payer: Commercial Managed Care - PPO | Admitting: Registered Nurse

## 2020-07-25 ENCOUNTER — Encounter: Payer: Self-pay | Admitting: Registered Nurse

## 2020-07-25 NOTE — Progress Notes (Signed)
Pt presented for labs Not seen or assessed by provider  Jari Sportsman, NP

## 2020-09-18 ENCOUNTER — Other Ambulatory Visit: Payer: Self-pay | Admitting: Registered Nurse

## 2020-09-18 DIAGNOSIS — E559 Vitamin D deficiency, unspecified: Secondary | ICD-10-CM

## 2020-09-19 MED ORDER — VITAMIN D (ERGOCALCIFEROL) 1.25 MG (50000 UNIT) PO CAPS: 50000.0000 [IU] | ORAL_CAPSULE | ORAL | 0 refills | Status: DC

## 2020-10-17 ENCOUNTER — Ambulatory Visit: Payer: Commercial Managed Care - PPO | Admitting: Family Medicine

## 2020-12-12 ENCOUNTER — Other Ambulatory Visit: Payer: Self-pay | Admitting: Registered Nurse

## 2020-12-12 DIAGNOSIS — E559 Vitamin D deficiency, unspecified: Secondary | ICD-10-CM

## 2021-01-23 ENCOUNTER — Encounter: Payer: Self-pay | Admitting: Medical

## 2021-01-23 ENCOUNTER — Ambulatory Visit (INDEPENDENT_AMBULATORY_CARE_PROVIDER_SITE_OTHER): Payer: Commercial Managed Care - PPO | Admitting: Medical

## 2021-01-23 ENCOUNTER — Other Ambulatory Visit: Payer: Self-pay

## 2021-01-23 VITALS — BP 120/78 | HR 98 | Temp 98.0°F | Wt 183.2 lb

## 2021-01-23 DIAGNOSIS — K649 Unspecified hemorrhoids: Secondary | ICD-10-CM | POA: Diagnosis not present

## 2021-01-23 DIAGNOSIS — E559 Vitamin D deficiency, unspecified: Secondary | ICD-10-CM

## 2021-01-23 DIAGNOSIS — F411 Generalized anxiety disorder: Secondary | ICD-10-CM | POA: Diagnosis not present

## 2021-01-23 DIAGNOSIS — K580 Irritable bowel syndrome with diarrhea: Secondary | ICD-10-CM | POA: Diagnosis not present

## 2021-01-23 DIAGNOSIS — Z789 Other specified health status: Secondary | ICD-10-CM

## 2021-01-23 DIAGNOSIS — Z862 Personal history of diseases of the blood and blood-forming organs and certain disorders involving the immune mechanism: Secondary | ICD-10-CM | POA: Insufficient documentation

## 2021-01-23 MED ORDER — VITAMIN D 25 MCG (1000 UNIT) PO TABS: 1000.0000 [IU] | ORAL_TABLET | Freq: Every day | ORAL | 1 refills | Status: DC

## 2021-01-23 MED ORDER — FERROUS GLUCONATE 324 (38 FE) MG PO TABS: 324.0000 mg | ORAL_TABLET | Freq: Every day | ORAL | 1 refills | Status: DC

## 2021-01-23 MED ORDER — ALIGN PREBIOTIC-PROBIOTIC 5-1.25 MG-GM PO CHEW: 1.0000 | CHEWABLE_TABLET | Freq: Every day | ORAL | 1 refills | Status: DC

## 2021-01-23 NOTE — Progress Notes (Signed)
Subjective:  Pamela Montoya is a 25 y.o. female who presents for Chief Complaint  Patient presents with   other    New pt. Est. Stomach issues aches and diarrhea hx of ibs       Here to establish care.  Was seeing [facility] before it closed recently  Medical Team: Kenn File, psychiatrist Flonnie Overman, with [facility]   She notes GI issues even from high schoool.  Was told she has IBS from prior.   Had colonoscopy at age 28.  Doesn't think anything else was found, was ultimately diagnosed with IBS.    She notes bad nausea, loose stools.   Thinks anxiety is related.  She notes bad anxiety, gets nausea with anxiety in general.   Loose stools 2-3 times per week.   Not every day.  During the work week stools are worse.  Sometimes loose stools 3-4 times daily, sometimes no loose stools.  Some days 8+ loose stools.   Gets cramps in abdomen.   Occasional blood in stool, bright red, and has hx/o hemorrhoids.   Has old prescription for hemorrhoid ointment that she will use prn.    Rarely gets constipation, mostly when traveling.  Does get some regular formed stools.  Weekends are improved.    Sees counselor and psychologist.   Saw therapist this morning.  Has been treated for anxiety since early 2021.  On Buspar which works well for her.    Goes in to work twice weekly but getting ready to go back fully remote.  Works as a IT sales professional.    Was on vit D earlier in the year.    No other aggravating or relieving factors.    No other c/o.  The following portions of the patient's history were reviewed and updated as appropriate: allergies, current medications, past family history, past medical history, past social history, past surgical history and problem list.  ROS Otherwise as in subjective above     Objective: BP 120/78   Pulse 98   Temp 98 F (36.7 C)   Wt 183 lb 3.2 oz (83.1 kg)   LMP 01/13/2021   BMI 35.78 kg/m   General  appearance: alert, no distress, well developed, well nourished, white female Neck: supple, no lymphadenopathy, no thyromegaly, no masses Heart: RRR, normal S1, S2, no murmurs Lungs: CTA bilaterally, no wheezes, rhonchi, or rales Abdomen: +bs, soft, non tender, non distended, no masses, no hepatomegaly, no splenomegaly Pulses: 2+ radial pulses, 2+ pedal pulses, normal cap refill Ext: no edema   Assessment: Encounter Diagnoses  Name Primary?   Irritable bowel syndrome with diarrhea Yes   Generalized anxiety disorder    Hemorrhoids, unspecified hemorrhoid type    Vitamin D deficiency    Vegetarian    History of anemia      Plan: We discussed her symptoms and concerns.  I look back in the chart record.  She had lab work done in November and December 2021 showing vitamin D deficiency and low ferritin iron on the low end of normal.  I will have her begin back on a supplement for both iron and vitamin D as well as a probiotic.  We also discussed other potential treatment for cramping and IBS symptoms such as Xifaxan or hyoscyamine.  For now we will hold off on these.  She had a bunch of lab evaluation in December 2021.  These labs are reviewed in the chart record.  Most of  these were normal, however the abnormalities are noted as above  Counseled on diet.  Continue routine follow-up with counselor and psychiatrist.  We will plan to recheck the vitamin D, iron, CBC at next visit  Versia was seen today for other.  Diagnoses and all orders for this visit:  Irritable bowel syndrome with diarrhea  Generalized anxiety disorder  Hemorrhoids, unspecified hemorrhoid type  Vitamin D deficiency  Vegetarian  History of anemia  Other orders -     ferrous gluconate (FERGON) 324 MG tablet; Take 1 tablet (324 mg total) by mouth daily with breakfast. -     cholecalciferol (VITAMIN D3) 25 MCG (1000 UNIT) tablet; Take 1 tablet (1,000 Units total) by mouth daily. -     Bacillus  Coagulans-Inulin (ALIGN PREBIOTIC-PROBIOTIC) 5-1.25 MG-GM CHEW; Chew 1 tablet by mouth daily.   Follow up: 26mo

## 2021-02-03 ENCOUNTER — Other Ambulatory Visit: Payer: Self-pay | Admitting: Medical

## 2021-02-03 MED ORDER — TANDEM 162-115.2 MG PO CAPS: 1.0000 | ORAL_CAPSULE | Freq: Every day | ORAL | 2 refills | Status: DC

## 2021-02-14 NOTE — Telephone Encounter (Deleted)
Encounter created accidentally. Please disregard.  

## 2021-02-24 ENCOUNTER — Other Ambulatory Visit: Payer: Self-pay | Admitting: Medical

## 2021-02-24 DIAGNOSIS — J4520 Mild intermittent asthma, uncomplicated: Secondary | ICD-10-CM

## 2021-02-24 MED ORDER — ALBUTEROL SULFATE HFA 108 (90 BASE) MCG/ACT IN AERS: 2.0000 | INHALATION_SPRAY | Freq: Four times a day (QID) | RESPIRATORY_TRACT | 0 refills | Status: DC | PRN

## 2021-02-24 NOTE — Progress Notes (Signed)
Inhaler sent.  On call message, leaving out of town and needs inhaler sent

## 2021-03-14 DIAGNOSIS — F411 Generalized anxiety disorder: Secondary | ICD-10-CM | POA: Diagnosis not present

## 2021-03-25 DIAGNOSIS — S99912A Unspecified injury of left ankle, initial encounter: Secondary | ICD-10-CM | POA: Diagnosis not present

## 2021-03-25 DIAGNOSIS — M25572 Pain in left ankle and joints of left foot: Secondary | ICD-10-CM | POA: Diagnosis not present

## 2021-03-27 ENCOUNTER — Other Ambulatory Visit: Payer: Self-pay

## 2021-03-27 ENCOUNTER — Ambulatory Visit: Payer: BC Managed Care – PPO | Admitting: Medical

## 2021-03-27 ENCOUNTER — Encounter: Payer: Self-pay | Admitting: Medical

## 2021-03-27 VITALS — BP 130/80 | HR 84

## 2021-03-27 DIAGNOSIS — F411 Generalized anxiety disorder: Secondary | ICD-10-CM | POA: Diagnosis not present

## 2021-03-27 DIAGNOSIS — K921 Melena: Secondary | ICD-10-CM | POA: Diagnosis not present

## 2021-03-27 DIAGNOSIS — E611 Iron deficiency: Secondary | ICD-10-CM

## 2021-03-27 DIAGNOSIS — E559 Vitamin D deficiency, unspecified: Secondary | ICD-10-CM

## 2021-03-27 DIAGNOSIS — K580 Irritable bowel syndrome with diarrhea: Secondary | ICD-10-CM | POA: Diagnosis not present

## 2021-03-27 DIAGNOSIS — K649 Unspecified hemorrhoids: Secondary | ICD-10-CM

## 2021-03-27 MED ORDER — HYDROCORTISONE ACETATE 25 MG RE SUPP: 25.0000 mg | Freq: Two times a day (BID) | RECTAL | 0 refills | Status: DC

## 2021-03-27 MED ORDER — RIFAXIMIN 550 MG PO TABS: 550.0000 mg | ORAL_TABLET | Freq: Three times a day (TID) | ORAL | 0 refills | Status: DC

## 2021-03-27 MED ORDER — HYDROCORTISONE 2.5 % EX CREA: TOPICAL_CREAM | Freq: Two times a day (BID) | CUTANEOUS | 1 refills | Status: DC

## 2021-03-27 NOTE — Patient Instructions (Signed)
Anemia I am rechecking labs today for iron and blood counts  Vitamin D deficiency  Labs today  Pelvic pain, heavy periods Recheck with gynecology for consideration of other evaluation and treatment   IBS  Begin Xifaxin medication 3 times daily x 14 days for IBS If not much improved with loose stools and frequency in the next month, then lets refer to gastroenterology  Hemorrhoids: I prescribed Hydrocortisone 2.5% cream today.  You can apply this topically to the external hemorrhoid twice daily short term for 3-5 days at a time as needed for itching, irritations, or swelling I prescribed Hydrocortisone suppository (Anusol or Proctosol brand) today. You can use this twice daily per rectum short term for 3-5 days at a time for hemorrhoid flare up including itching, irritations, swelling or discomfort Please review the information below If not improving in the next few days or worsening, then call or recheck.  Medication costs:  If you get to the pharmacy and medication prescribed today was either too expensive, not covered by your insurance, or required prior authorization, then please call us back to let us know.  We often have no way to know if a medication is too expensive or not covered by your insurance.  Thanks for your cooperation.   Hemorrhoids Hemorrhoids are swollen veins in and around the rectum or anus. There are two types of hemorrhoids: Internal hemorrhoids. These occur in the veins that are just inside the rectum. They may poke through to the outside and become irritated and painful. External hemorrhoids. These occur in the veins that are outside of the anus and can be felt as a painful swelling or hard lump near the anus.  Most hemorrhoids do not cause serious problems, and they can be managed with home treatments such as diet and lifestyle changes. If home treatments do not help your symptoms, procedures can be done to shrink or remove the hemorrhoids. What are the  causes? This condition is caused by increased pressure in the anal area. This pressure may result from various things, including: Constipation. Straining to have a bowel movement. Diarrhea. Pregnancy. Obesity. Sitting for long periods of time. Heavy lifting or other activity that causes you to strain. Anal sex.  What are the signs or symptoms? Symptoms of this condition include: Pain. Anal itching or irritation. Rectal bleeding. Leakage of stool (feces). Anal swelling. One or more lumps around the anus.  How is this diagnosed? This condition can often be diagnosed through a visual exam. Other exams or tests may also be done, such as: Examination of the rectal area with a gloved hand (digital rectal exam). Examination of the anal canal using a small tube (anoscope). A blood test, if you have lost a significant amount of blood. A test to look inside the colon (sigmoidoscopy or colonoscopy).  How is this treated? This condition can usually be treated at home. However, various procedures may be done if dietary changes, lifestyle changes, and other home treatments do not help your symptoms. These procedures can help make the hemorrhoids smaller or remove them completely. Some of these procedures involve surgery, and others do not. Common procedures include: Rubber band ligation. Rubber bands are placed at the base of the hemorrhoids to cut off the blood supply to them. Sclerotherapy. Medicine is injected into the hemorrhoids to shrink them. Infrared coagulation. A type of light energy is used to get rid of the hemorrhoids. Hemorrhoidectomy surgery. The hemorrhoids are surgically removed, and the veins that supply them are tied  off. Stapled hemorrhoidopexy surgery. A circular stapling device is used to remove the hemorrhoids and use staples to cut off the blood supply to them.  Follow these instructions at home: Eating and drinking Eat foods that have a lot of fiber in them, such as  whole grains, beans, nuts, fruits, and vegetables. Ask your health care provider about taking products that have added fiber (fiber supplements). Drink enough fluid to keep your urine clear or pale yellow. Managing pain and swelling Take warm sitz baths for 20 minutes, 3-4 times a day to ease pain and discomfort. If directed, apply ice to the affected area. Using ice packs between sitz baths may be helpful. Put ice in a plastic bag. Place a towel between your skin and the bag. Leave the ice on for 20 minutes, 2-3 times a day. General instructions Take over-the-counter and prescription medicines only as told by your health care provider. Use medicated creams or suppositories as told. Exercise regularly. Go to the bathroom when you have the urge to have a bowel movement. Do not wait. Avoid straining to have bowel movements. Keep the anal area dry and clean. Use wet toilet paper or moist towelettes after a bowel movement. Do not sit on the toilet for long periods of time. This increases blood pooling and pain. Contact a health care provider if: You have increasing pain and swelling that are not controlled by treatment or medicine. You have uncontrolled bleeding. You have difficulty having a bowel movement, or you are unable to have a bowel movement. You have pain or inflammation outside the area of the hemorrhoids. This information is not intended to replace advice given to you by your health care provider. Make sure you discuss any questions you have with your health care provider. Document Released: 06/15/2000 Document Revised: 11/16/2015 Document Reviewed: 03/02/2015 Elsevier Interactive Patient Education  [employer].

## 2021-03-27 NOTE — Progress Notes (Signed)
Subjective:  Pamela Montoya is a 25 y.o. female who presents for Chief Complaint  Patient presents with   2 month follow-up    2 month follow-up. Having blood in stool when x 3 days of period.       Here for recheck on IBS and blood in the stool.  She notes GI issues even from high school.  Was told she has IBS from prior GI doctor in her younger years.  Had colonoscopy at age 25.  Doesn't think anything else was found, was ultimately diagnosed with IBS.  She notes that every month during the first 2 days of her period she has up to 8 loose stools a day and blood in the stool, bright red.  She does not have blood in the stool other days of the month just in the first 2 days of her period.  In general she does have fairly frequent loose stools though.  She has bad nausea, loose stools.   Thinks anxiety is related.  She notes bad anxiety, gets nausea with anxiety in general.   Loose stools 2-3 times per week.   Not every day.  During the work week stools are worse.  Sometimes loose stools 3-4 times daily, sometimes no loose stools.  Some days 8+ loose stools.   Gets cramps in abdomen.   Occasional blood in stool, bright red, and has hx/o hemorrhoids.     Rarely gets constipation, mostly when traveling.  Does get some regular formed stools.  Weekends are improved.    Sees counselor and psychologist.   Saw therapist this morning.  Has been treated for anxiety since early 2021.  On Buspar which works well for her.    Goes in to work twice weekly but getting ready to go back fully remote.  Works as a IT sales professional.    Here to recheck on iron deficiency anemia.  She has been taking the tandem iron the last 2 months without complaint  Also here for recheck on vitamin D deficiency.  She is taking a vitamin D supplement daily for the last 2 months  Her periods last about 5 days but are quite painful and typically the first 3 days or so bad she has to stay home from work due to pain and cramping.  She  saw gynecology in December.  Given her history of pseudotumor cerebri she does not want to use combined estrogen hormone therapy  She recently injured her left ankle after she stepped in a pothole.  She saw another provider and has a brace and crutches currently.  No other aggravating or relieving factors.    No other c/o.  The following portions of the patient's history were reviewed and updated as appropriate: allergies, current medications, past family history, past medical history, past social history, past surgical history and problem list.  ROS Otherwise as in subjective above     Objective: BP 130/80   Pulse 84   LMP 03/11/2021   General appearance: alert, no distress, well developed, well nourished, white female Abdomen: +bs, soft, non tender, non distended, no masses, no hepatomegaly, no splenomegaly Small anterior and external hemorrhoid, no obvious bleeding hemorrhoid, anoscopy reveals small internal hemorrhoids but none friable, none swollen  Exam chaperoned by nurse    Assessment: Encounter Diagnoses  Name Primary?   Irritable bowel syndrome with diarrhea Yes   Blood in stool    Vitamin D deficiency    Generalized anxiety disorder    Hemorrhoids, unspecified hemorrhoid  type    Iron deficiency      Plan: IBS diarrhea dominant-begin trial of Xifaxan.  Discussed risk and benefits of medication  Blood in the stool, hemorrhoids-I suspect the increase in stool frequency during the first 2 days of her periods are probably leading to some bleeding hemorrhoids.  She will begin hot bath soaks, steroid suppository and a steroid cream the next couple times this happens.  If this does not improve things then we will likely refer to gastroenterology  Vitamin D deficiency-recheck labs today now that she has been on supplement for 2 months  Iron deficiency-recheck labs today since she has been on iron supplement for 2 months   [name] was seen today for 2 month  follow-up.  Diagnoses and all orders for this visit:  Irritable bowel syndrome with diarrhea  Blood in stool -     CBC with Differential/Platelet -     Iron, TIBC and Ferritin Panel  Vitamin D deficiency -     VITAMIN D 25 Hydroxy (Vit-D Deficiency, Fractures)  Generalized anxiety disorder  Hemorrhoids, unspecified hemorrhoid type  Iron deficiency -     CBC with Differential/Platelet -     Iron, TIBC and Ferritin Panel  Other orders -     hydrocortisone 2.5 % cream; Apply topically 2 (two) times daily. -     hydrocortisone (ANUSOL-HC) 25 MG suppository; Place 1 suppository (25 mg total) rectally 2 (two) times daily. -     rifaximin (XIFAXAN) 550 MG TABS tablet; Take 1 tablet (550 mg total) by mouth 3 (three) times daily.   Follow up: Pending labs

## 2021-03-28 ENCOUNTER — Other Ambulatory Visit: Payer: Self-pay | Admitting: Medical

## 2021-03-28 LAB — CBC WITH DIFFERENTIAL/PLATELET
Basophils Absolute: 0.1 10*3/uL (ref 0.0–0.2)
Basos: 1 %
EOS (ABSOLUTE): 0.1 10*3/uL (ref 0.0–0.4)
Eos: 1 %
Hematocrit: 40 % (ref 34.0–46.6)
Hemoglobin: 12.9 g/dL (ref 11.1–15.9)
Immature Grans (Abs): 0 10*3/uL (ref 0.0–0.1)
Immature Granulocytes: 0 %
Lymphocytes Absolute: 3.2 10*3/uL — ABNORMAL HIGH (ref 0.7–3.1)
Lymphs: 31 %
MCH: 28.4 pg (ref 26.6–33.0)
MCHC: 32.3 g/dL (ref 31.5–35.7)
MCV: 88 fL (ref 79–97)
Monocytes Absolute: 0.6 10*3/uL (ref 0.1–0.9)
Monocytes: 6 %
Neutrophils Absolute: 6.4 10*3/uL (ref 1.4–7.0)
Neutrophils: 61 %
Platelets: 376 10*3/uL (ref 150–450)
RBC: 4.55 x10E6/uL (ref 3.77–5.28)
RDW: 14.1 % (ref 11.7–15.4)
WBC: 10.4 10*3/uL (ref 3.4–10.8)

## 2021-03-28 LAB — IRON,TIBC AND FERRITIN PANEL
Ferritin: 19 ng/mL (ref 15–150)
Iron Saturation: 23 % (ref 15–55)
Iron: 81 ug/dL (ref 27–159)
Total Iron Binding Capacity: 348 ug/dL (ref 250–450)
UIBC: 267 ug/dL (ref 131–425)

## 2021-03-28 LAB — VITAMIN D 25 HYDROXY (VIT D DEFICIENCY, FRACTURES): Vit D, 25-Hydroxy: 33 ng/mL (ref 30.0–100.0)

## 2021-03-28 MED ORDER — VITAMIN D 25 MCG (1000 UNIT) PO TABS
1000.0000 [IU] | ORAL_TABLET | Freq: Every day | ORAL | 1 refills | Status: DC
Start: 2021-03-28 — End: 2021-07-24

## 2021-03-28 MED ORDER — TANDEM 162-115.2 MG PO CAPS: 1.0000 | ORAL_CAPSULE | Freq: Every day | ORAL | 1 refills | Status: DC

## 2021-04-10 ENCOUNTER — Encounter: Payer: Self-pay | Admitting: Internal Medicine

## 2021-06-05 ENCOUNTER — Other Ambulatory Visit: Payer: Self-pay | Admitting: Internal Medicine

## 2021-06-05 DIAGNOSIS — J4520 Mild intermittent asthma, uncomplicated: Secondary | ICD-10-CM

## 2021-06-05 DIAGNOSIS — G932 Benign intracranial hypertension: Secondary | ICD-10-CM | POA: Diagnosis not present

## 2021-06-05 DIAGNOSIS — H47333 Pseudopapilledema of optic disc, bilateral: Secondary | ICD-10-CM | POA: Diagnosis not present

## 2021-06-05 MED ORDER — BUSPIRONE HCL 5 MG PO TABS: 5.0000 mg | ORAL_TABLET | Freq: Two times a day (BID) | ORAL | 1 refills | Status: DC

## 2021-06-05 MED ORDER — ALBUTEROL SULFATE HFA 108 (90 BASE) MCG/ACT IN AERS: 2.0000 | INHALATION_SPRAY | Freq: Four times a day (QID) | RESPIRATORY_TRACT | 0 refills | Status: DC | PRN

## 2021-07-06 IMAGING — US US ABDOMEN LIMITED
1 series · 14 of 25 positions shown · non-contrast
Comparison: None.

CLINICAL DATA: Elevated liver function test.

EXAM:
ULTRASOUND ABDOMEN LIMITED RIGHT UPPER QUADRANT

[Series 1: us abdomen limited · 14 of 44 slices shown]
[im 1/44]
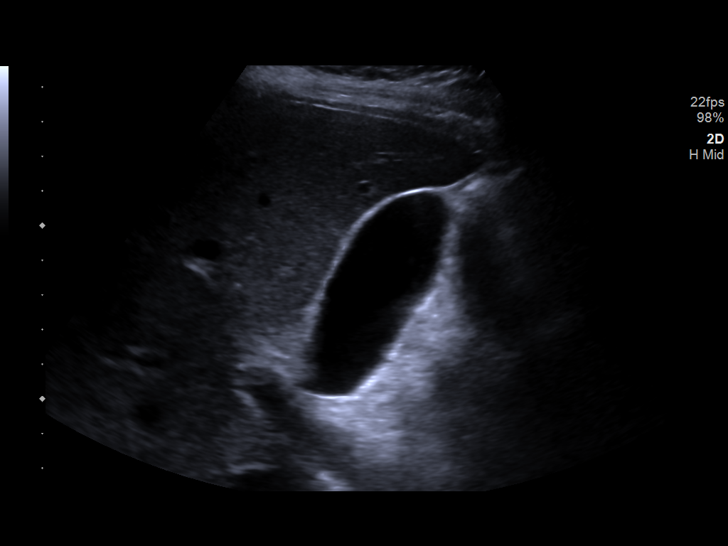
[im 4/44]
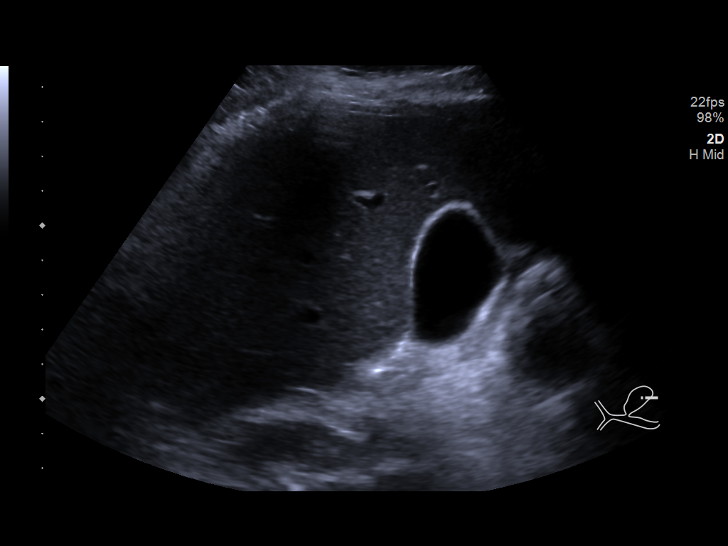
[im 8/44]
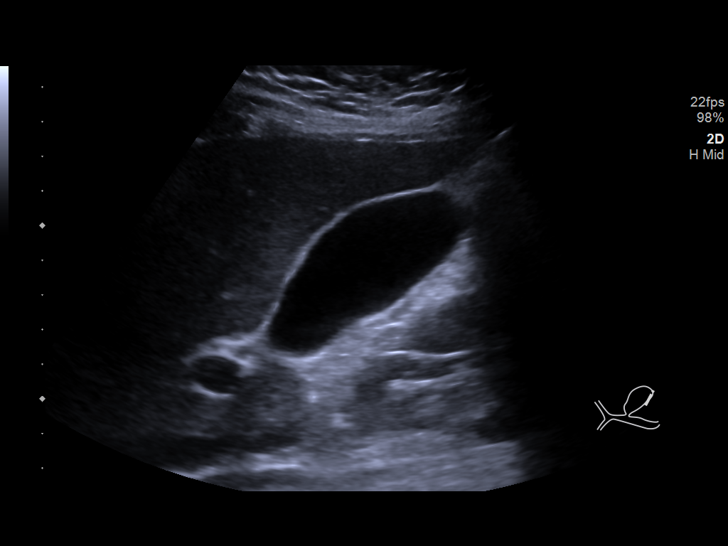
[im 11/44]
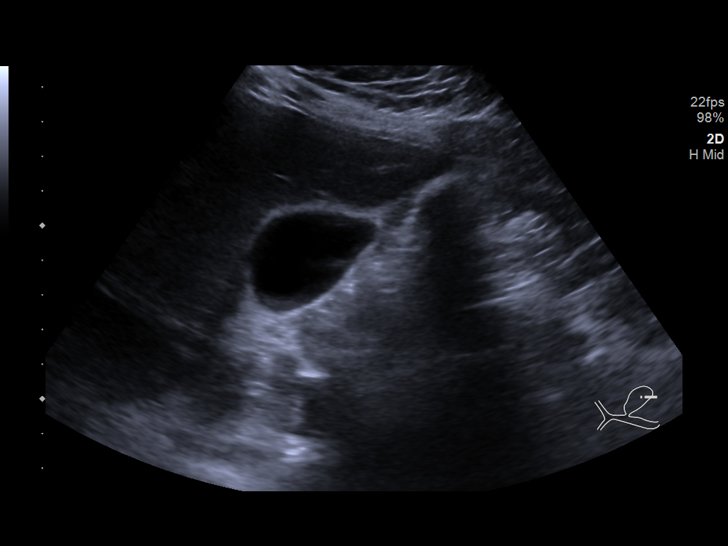
[im 15/44]
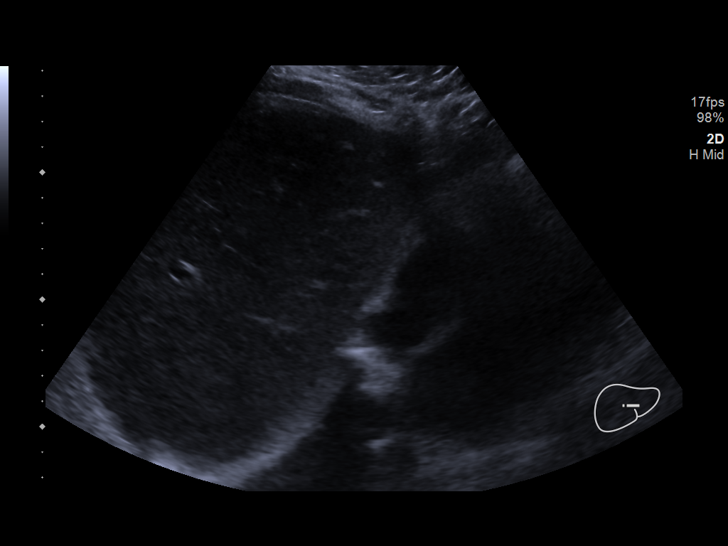
[im 17/44]
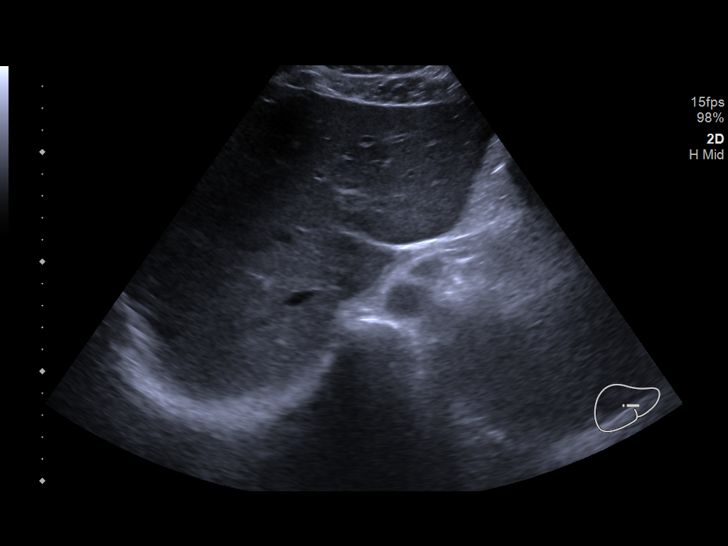
[im 20/44]
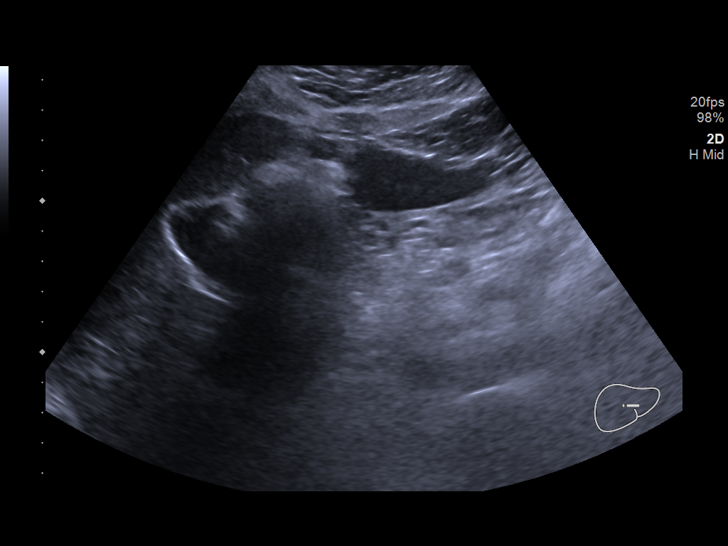
[im 24/44]
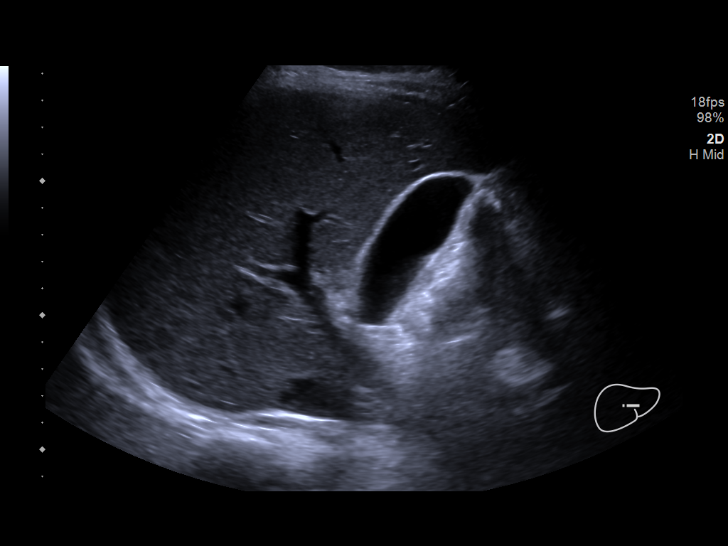
[im 27/44]
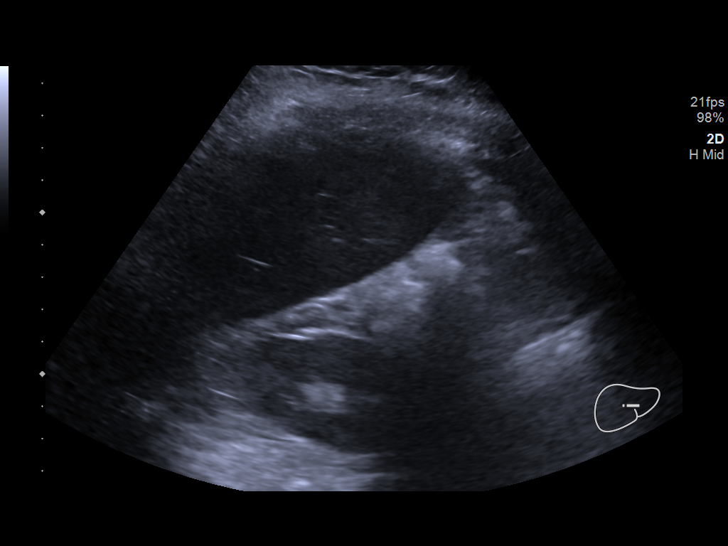
[im 29/44]
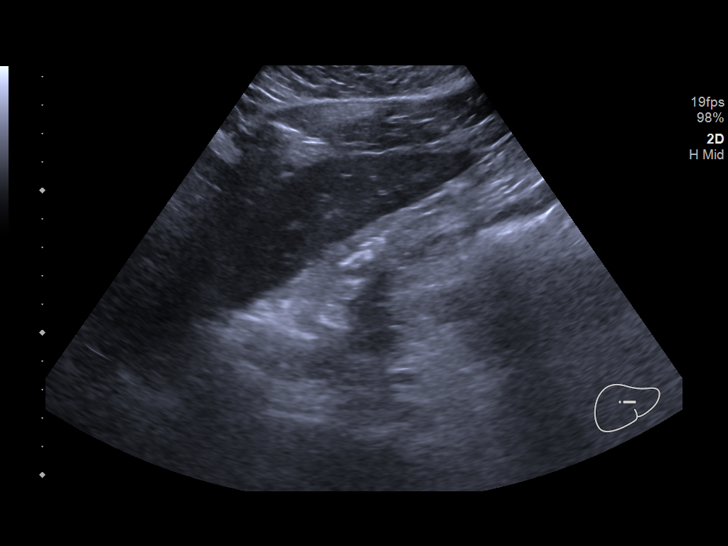
[im 33/44]
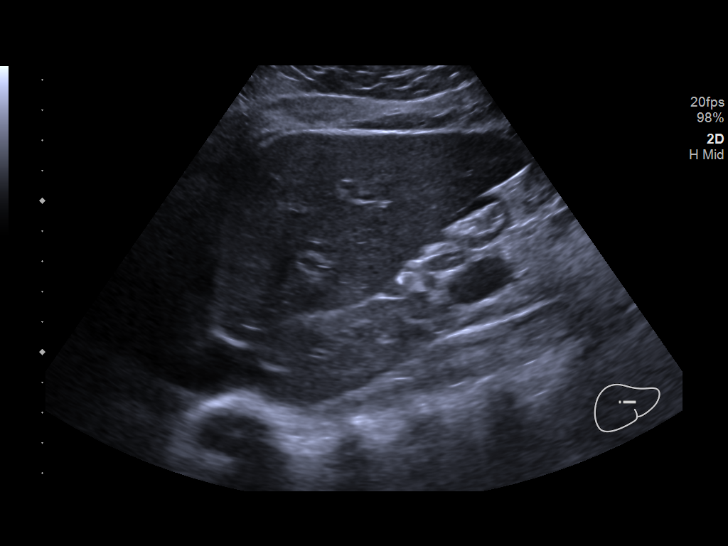
[im 36/44]
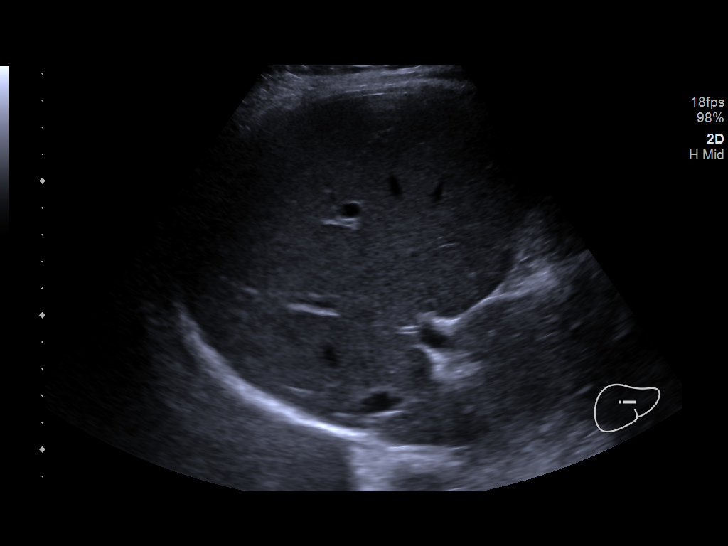
[im 40/44]
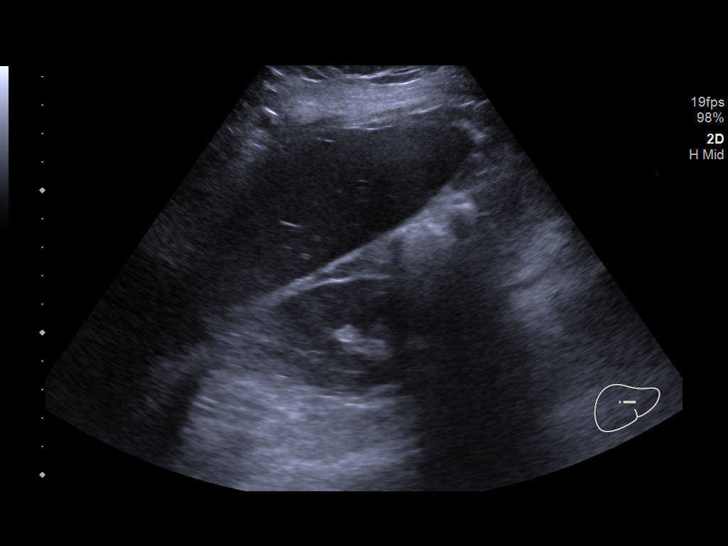
[im 44/44]
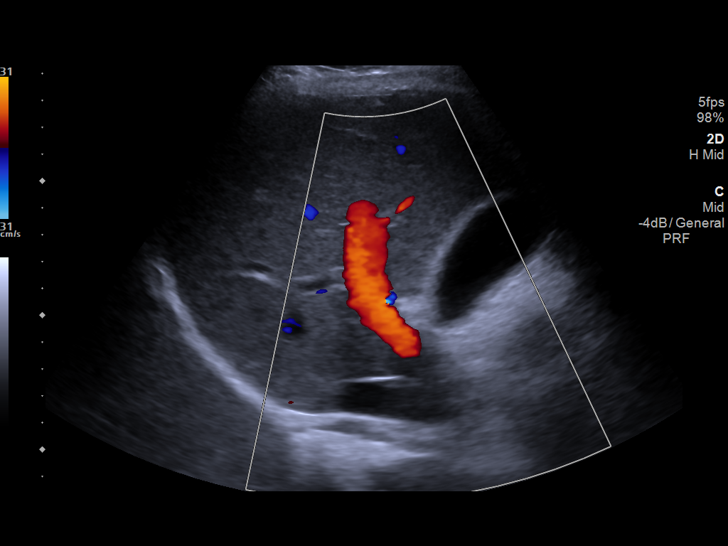

[14 of 25 positions shown; findings below may reference images not displayed]

FINDINGS: Gallbladder:

No gallstones or wall thickening visualized. No sonographic Murphy
sign noted by sonographer.

Common bile duct:

Diameter: 2 mm

Liver:

No focal lesion identified. Within normal limits in parenchymal
echogenicity. Portal vein is patent on color Doppler imaging with
normal direction of blood flow towards the liver.

Other: None.
IMPRESSION: Normal right upper quadrant ultrasound. Normal sonographic
appearance of the liver.

## 2021-07-10 ENCOUNTER — Other Ambulatory Visit: Payer: Self-pay | Admitting: Internal Medicine

## 2021-07-10 ENCOUNTER — Other Ambulatory Visit (HOSPITAL_COMMUNITY): Payer: Self-pay

## 2021-07-10 DIAGNOSIS — J4520 Mild intermittent asthma, uncomplicated: Secondary | ICD-10-CM

## 2021-07-10 MED ORDER — ALBUTEROL SULFATE HFA 108 (90 BASE) MCG/ACT IN AERS
2.0000 | INHALATION_SPRAY | Freq: Four times a day (QID) | RESPIRATORY_TRACT | 0 refills | Status: AC | PRN
  Filled 2021-07-10: qty 18, 25d supply, fill #0

## 2021-07-12 ENCOUNTER — Other Ambulatory Visit (HOSPITAL_COMMUNITY): Payer: Self-pay

## 2021-07-12 DIAGNOSIS — F331 Major depressive disorder, recurrent, moderate: Secondary | ICD-10-CM | POA: Diagnosis not present

## 2021-07-21 ENCOUNTER — Other Ambulatory Visit: Payer: Self-pay | Admitting: Medical

## 2021-07-27 ENCOUNTER — Telehealth (INDEPENDENT_AMBULATORY_CARE_PROVIDER_SITE_OTHER): Payer: BC Managed Care – PPO | Admitting: Family Medicine

## 2021-07-27 ENCOUNTER — Encounter: Payer: Self-pay | Admitting: Family Medicine

## 2021-07-27 VITALS — Temp 97.9°F | Ht 60.0 in | Wt 185.0 lb

## 2021-07-27 DIAGNOSIS — U071 COVID-19: Secondary | ICD-10-CM

## 2021-07-27 NOTE — Patient Instructions (Signed)
Monday was day 0 Isolate days 1-5 (Tues through Sat) If on Sunday (day 6) you are no longer having fever and your respiratory symptoms are improving, you can leave the house, but wear a mask 100% of the time (don't eat with others). This is for days 6-10 (Sunday through Thursday). Day 11 (a week from Friday), restrictions are lifted.  Cont ibuprofen if needed for any pain, headache, fever, body aches. You may continue the Dayquil for congestion and cough.  You may add a PLAIN mucinex (sole ingredient being guaifenesin, so that there aren't any interactions with the Dayquil), as an expectorant to thin out any thick mucus or phlegm. Sinus rinses (net-pot or sinus rinse kit) can help with sinus pain not relieved by Dayquil.  Eat a bland diet, limit dairy, consider probiotics to reset the gut, if having prolonged diarrhea.  Let us know if you develop asthma/wheezing that isn't responding to your inhaler (we can send in steroids). Seek re-evaluation if you have chest pain, pain with breathing, persistent high fever, confusion, weakness, swollen/painful leg, or other concerns.

## 2021-07-27 NOTE — Progress Notes (Signed)
Start time: 1:27 End time: 1:47  Virtual Visit via Video Note  I connected with Pamela Montoya on 07/27/21 by a video enabled telemedicine application and verified that I am speaking with the correct person using two identifiers.  Location: Patient: home Provider: office   I discussed the limitations of evaluation and management by telemedicine and the availability of in person appointments. The patient expressed understanding and agreed to proceed.  History of Present Illness:  Chief Complaint  Patient presents with   Covid Positive    VIRTUAL positive covid today. Monday night she started with some sinus issues. Tuesday temp of 102. Still having sinus issues, dry cough and started with some diarrhea today.    1/23, in the evening, she started with some sinus congestion.  She woke up Tuesday with 101 fever.  She is having sinus drainage, sneezing, coughing, ears are stuffed up.  She feels very fatigued. +myalgias on Tues/Wed. Today she is feeling better--no aches, chills or fever.  Still has fatigue and congestion. Nasal mucus is clear, occ green.  Cough is nonproductive.  Mucus is thick.  Some chest congestion, but improving. Tested + for COVID today.  Husband has also been sick, tested + today also.  Has had COVID vaccines x 4 (1st booster date is missing from chart; had bivalent booster in October).  OTC meds--Took ibuprofen the first day. Started Dayquil q8 hours on Wednesday, no meds since 8pm yesterday. Dayquil--acet/DM, phenylephrine  She has j/o asthma, but hasn't been triggered with this illness, not needing inhaler. Asthma is mainly exercise-induced. No hospitalizations. Has a new albuterol at home.  PMH, PSH, SH reviewed  Outpatient Encounter Medications as of 07/27/2021  Medication Sig Note   busPIRone (BUSPAR) 5 MG tablet Take 1 tablet (5 mg total) by mouth 2 (two) times daily.    cholecalciferol (VITAMIN D) 25 MCG (1000 UNIT) tablet TAKE 1 TABLET BY MOUTH EVERY  DAY    Pseudoephedrine-APAP-DM (DAYQUIL PO) Take 1 capsule by mouth as needed. 07/27/2021: Last dose 8pm   TANDEM 162-115.2 MG CAPS capsule TAKE 1 CAPSULE BY MOUTH EVERY MORNING (Patient taking differently: 1 capsule every other day.)    UNABLE TO FIND Lavender supplement 07/27/2021: Once daily   albuterol (VENTOLIN HFA) 108 (90 Base) MCG/ACT inhaler Inhale 2 puffs into the lungs every 6 (six) hours as needed for wheezing or shortness of breath. (Patient not taking: Reported on 07/27/2021) 07/27/2021: As needed   hydrocortisone 2.5 % cream Apply topically 2 (two) times daily. (Patient not taking: Reported on 07/27/2021) 07/27/2021: Usually 1-2 times daily, not since Monday   [DISCONTINUED] hydrocortisone (ANUSOL-HC) 25 MG suppository Place 1 suppository (25 mg total) rectally 2 (two) times daily.    [DISCONTINUED] ibuprofen (ADVIL) 200 MG tablet Take 200 mg by mouth every 6 (six) hours as needed for moderate pain.    [DISCONTINUED] rifaximin (XIFAXAN) 550 MG TABS tablet Take 1 tablet (550 mg total) by mouth 3 (three) times daily.    No facility-administered encounter medications on file as of 07/27/2021.   Allergies  Allergen Reactions   Ciprofloxacin     pain   Meloxicam     Dizzy   ROS:  see HPI for URI symptoms. No chest pain, shortness of breath, wheezing. Some nausea and diarrhea (today), no vomiting. No rashes or other complaints.   Observations/Objective:  Temp 97.9 F (36.6 C) (Temporal)    Ht 5' (1.524 m)    Wt 185 lb (83.9 kg)    LMP 07/13/2021 (Exact  Date)    BMI 36.13 kg/m   Alert, oriented, seems energetic. Sounds slightly nasal.  No coughing or throat-clearing during visit. Cranial nerves are grossly intact. Exam is limited due to virtual nature of the visit.  Assessment and Plan:  COVID-19 virus infection - reviewed supportive measure, s/sx complications for which she should seek re-eval  Monday was day 0 Isolate days 1-5 (Tues through Sat) If on Sunday (day 6) you are  no longer having fever and your respiratory symptoms are improving, you can leave the house, but wear a mask 100% of the time (don't eat with others). This is for days 6-10 (Sunday through Thursday). Day 11 (a week from Friday), restrictions are lifted.  Cont ibuprofen if needed for any pain, headache, fever, body aches. You may continue the Dayquil for congestion and cough.  You may add a PLAIN mucinex (sole ingredient being guaifenesin, so that there aren't any interactions with the Dayquil), as an expectorant to thin out any thick mucus or phlegm. Sinus rinses (net-pot or sinus rinse kit) can help with sinus pain not relieved by Dayquil.  Eat a bland diet, limit dairy, consider probiotics to reset the gut, if having prolonged diarrhea.  Let us know if you develop asthma/wheezing that isn't responding to your inhaler (we can send in steroids). Seek re-evaluation if you have chest pain, pain with breathing, persistent high fever, confusion, weakness, swollen/painful leg, or other concerns.   Follow Up Instructions:    I discussed the assessment and treatment plan with the patient. The patient was provided an opportunity to ask questions and all were answered. The patient agreed with the plan and demonstrated an understanding of the instructions.   The patient was advised to call back or seek an in-person evaluation if the symptoms worsen or if the condition fails to improve as anticipated.  I spent 23 minutes dedicated to the care of this patient, including pre-visit review of records, face to face time, post-visit ordering of testing and documentation.    Vikki Ports, MD

## 2021-07-28 ENCOUNTER — Encounter: Payer: Self-pay | Admitting: Family Medicine

## 2021-08-02 DIAGNOSIS — F411 Generalized anxiety disorder: Secondary | ICD-10-CM | POA: Diagnosis not present

## 2021-08-16 DIAGNOSIS — F331 Major depressive disorder, recurrent, moderate: Secondary | ICD-10-CM | POA: Diagnosis not present

## 2021-08-17 ENCOUNTER — Other Ambulatory Visit: Payer: Self-pay | Admitting: Medical

## 2021-09-01 DIAGNOSIS — F411 Generalized anxiety disorder: Secondary | ICD-10-CM | POA: Diagnosis not present

## 2021-09-14 NOTE — Telephone Encounter (Signed)
Please close

## 2021-09-15 ENCOUNTER — Other Ambulatory Visit: Payer: Self-pay

## 2021-09-15 ENCOUNTER — Ambulatory Visit: Payer: BC Managed Care – PPO | Admitting: Medical

## 2021-09-15 ENCOUNTER — Encounter: Payer: Self-pay | Admitting: Medical

## 2021-09-15 VITALS — BP 120/78 | HR 110 | Wt 191.2 lb

## 2021-09-15 DIAGNOSIS — F411 Generalized anxiety disorder: Secondary | ICD-10-CM

## 2021-09-15 DIAGNOSIS — K580 Irritable bowel syndrome with diarrhea: Secondary | ICD-10-CM | POA: Diagnosis not present

## 2021-09-15 DIAGNOSIS — E559 Vitamin D deficiency, unspecified: Secondary | ICD-10-CM

## 2021-09-15 DIAGNOSIS — Z862 Personal history of diseases of the blood and blood-forming organs and certain disorders involving the immune mechanism: Secondary | ICD-10-CM

## 2021-09-15 DIAGNOSIS — E611 Iron deficiency: Secondary | ICD-10-CM

## 2021-09-15 DIAGNOSIS — Z789 Other specified health status: Secondary | ICD-10-CM

## 2021-09-15 DIAGNOSIS — Z8349 Family history of other endocrine, nutritional and metabolic diseases: Secondary | ICD-10-CM

## 2021-09-15 DIAGNOSIS — K921 Melena: Secondary | ICD-10-CM

## 2021-09-15 DIAGNOSIS — K649 Unspecified hemorrhoids: Secondary | ICD-10-CM

## 2021-09-15 MED ORDER — HYDROCORTISONE 2.5 % EX CREA: TOPICAL_CREAM | Freq: Two times a day (BID) | CUTANEOUS | 1 refills | Status: DC

## 2021-09-15 NOTE — Progress Notes (Signed)
Subjective: ? Pamela Montoya is a 26 y.o. female who presents for ?Chief Complaint  ?Patient presents with  ? follow-up  ?  Follow-up on anemia and IBS, has hemohorrid that she wants looked at. Feels like its going down.   ?   ?Here for recheck.  I saw her back in September for multiple concerns ? ?In recent months she has been doing really well other than having COVID relatively recently ? ?Last visit she was having hemorrhoids and blood in the stool but this was mainly around the time of her periods.  She is still getting some similar symptoms but not as bad and only during her menstrual cycle.  She does use the hydrocortisone cream topically for irritation or hemorrhoid externally as well as hot soaks ? ?She is not getting blood in the stool other times ? ?After last visit we had recommended a trial of Xifaxan x2 weeks which she did not finish this.  However she started doing a morning breakfast smoothie daily along with taking her iron pill every other day.  The smoothie has a lot of fiber and this is greatly improved her bowels.  She does not have a lot of issues with loose stool or pain currently.  She does her iron pill every other day except on her menstrual cycle she would do it daily. ? ?She has questions about hemochromatosis.  She has several family members with history of hemochromatosis.  A cousin of hers just passed away in his 30s with hemochromatosis.  He was part black, but the rest of her family members with hemochromatosis are white ? ?Vitamin D deficiency-compliant with supplement ? ?Anxiety-doing okay on current BuSpar twice daily and she takes lavender supplement daily. ? ?She has seen gastroenterology but has been 10+ years ago. ? ?Since last visit has not had any more nausea, GI issues have been really calm down. ? ?No other aggravating or relieving factors.   ? ?No other c/o. ? ?The following portions of the patient's history were reviewed and updated as appropriate: allergies, current  medications, past family history, past medical history, past social history, past surgical history and problem list. ? ?ROS ?Otherwise as in subjective above ? ?Objective: ?BP 120/78   Pulse (!) 110   Wt 191 lb 3.2 oz (86.7 kg)   BMI 37.34 kg/m?  ? ?General appearance: alert, no distress, well developed, well nourished ?Abdomen: +bs, soft, non tender, non distended, no masses, no hepatomegaly, no splenomegaly ?Pulses: 2+ radial pulses, 2+ pedal pulses, normal cap refill ?Ext: no edema ? ? ? ?Assessment: ?Encounter Diagnoses  ?Name Primary?  ? Irritable bowel syndrome with diarrhea Yes  ? Vitamin D deficiency   ? Iron deficiency   ? History of anemia   ? Generalized anxiety disorder   ? Blood in stool   ? Hemorrhoids, unspecified hemorrhoid type   ? Vegetarian   ? Family history of hemochromatosis   ? ? ? ?Plan: ?IBS-D-much improved of late.  She was prescribed a trial of Xifaxan last visit but ended up not taking this.  Her bowels have been fine doing a breakfast may do extra fiber daily as well as iron supplement every other day ? ?Vitamin D deficiency-continue supplement. ? ?Iron deficiency-most recent labs in September were normal.  Continue iron supplement every other day except daily during her menstrual period ? ?Anxiety-doing fine on buspirone ? ?Hemorrhoids-use hot bath soaks and hydrocortisone cream topically as needed.  Continue good fiber intake daily.  Avoid straining  on the toilet ? ?Congratulated her on her recent marriage ? ?Pamela Montoya was seen today for follow-up. ? ?Diagnoses and all orders for this visit: ? ?Irritable bowel syndrome with diarrhea ? ?Vitamin D deficiency ? ?Iron deficiency ? ?History of anemia ? ?Generalized anxiety disorder ? ?Blood in stool ? ?Hemorrhoids, unspecified hemorrhoid type ? ?Vegetarian ? ?Family history of hemochromatosis ? ?Other orders ?-     hydrocortisone 2.5 % cream; Apply topically 2 (two) times daily. ? ? ? ?Follow up: few months fasting for physical ?

## 2021-10-04 DIAGNOSIS — F411 Generalized anxiety disorder: Secondary | ICD-10-CM | POA: Diagnosis not present

## 2021-10-24 ENCOUNTER — Other Ambulatory Visit: Payer: Self-pay | Admitting: Medical

## 2021-10-26 ENCOUNTER — Other Ambulatory Visit: Payer: Self-pay | Admitting: Medical

## 2021-10-26 DIAGNOSIS — K921 Melena: Secondary | ICD-10-CM

## 2021-10-26 DIAGNOSIS — K649 Unspecified hemorrhoids: Secondary | ICD-10-CM

## 2021-10-26 DIAGNOSIS — K580 Irritable bowel syndrome with diarrhea: Secondary | ICD-10-CM

## 2021-10-26 MED ORDER — HYDROCORTISONE 2.5 % EX CREA: TOPICAL_CREAM | Freq: Two times a day (BID) | CUTANEOUS | 1 refills | Status: DC

## 2021-10-27 DIAGNOSIS — F411 Generalized anxiety disorder: Secondary | ICD-10-CM | POA: Diagnosis not present

## 2021-10-30 DIAGNOSIS — R110 Nausea: Secondary | ICD-10-CM | POA: Diagnosis not present

## 2021-10-30 DIAGNOSIS — K625 Hemorrhage of anus and rectum: Secondary | ICD-10-CM | POA: Diagnosis not present

## 2021-10-30 DIAGNOSIS — K582 Mixed irritable bowel syndrome: Secondary | ICD-10-CM | POA: Diagnosis not present

## 2021-11-15 ENCOUNTER — Encounter: Payer: BC Managed Care – PPO | Admitting: Medical

## 2021-11-24 DIAGNOSIS — F411 Generalized anxiety disorder: Secondary | ICD-10-CM | POA: Diagnosis not present

## 2021-12-22 DIAGNOSIS — F411 Generalized anxiety disorder: Secondary | ICD-10-CM | POA: Diagnosis not present

## 2021-12-30 ENCOUNTER — Other Ambulatory Visit: Payer: Self-pay | Admitting: Medical

## 2022-01-21 IMAGING — US US PELVIS COMPLETE WITH TRANSVAGINAL
1 series · 13 of 25 positions shown · non-contrast
Comparison: Prior ultrasound from 06/16/2018.

CLINICAL DATA: Initial evaluation for menorrhagia with regular
cycle. Evaluate for endometriosis.



[Series 1: us pelvis complete with transvaginal · 0.30mm/px · 100 acquisitions, 13 frames shown]
[im 1/100]
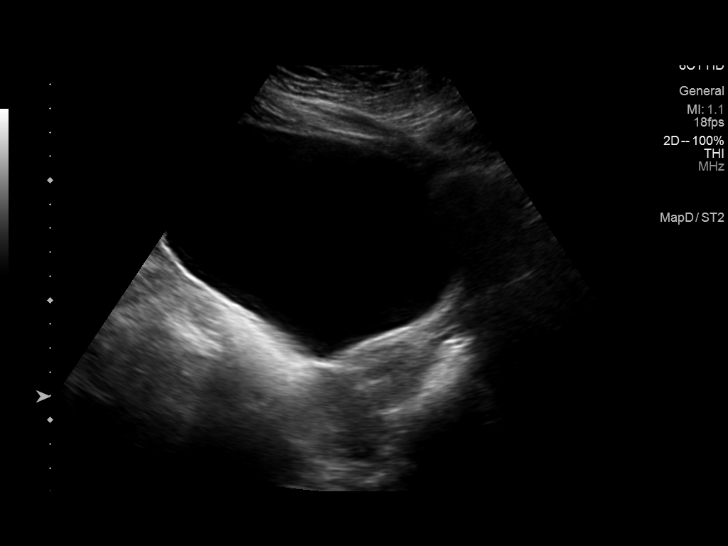
[im 9/100]
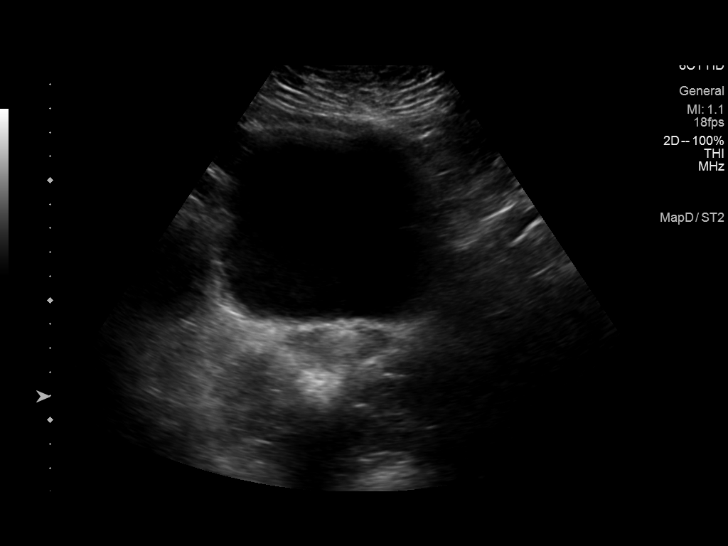
[im 17/100]
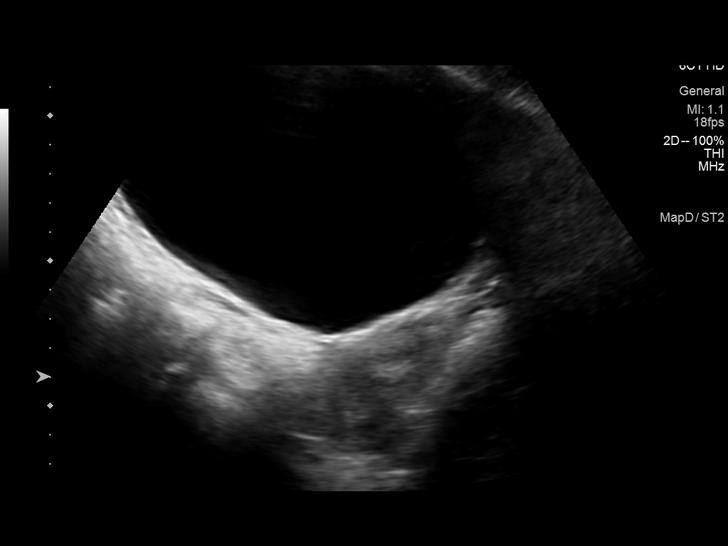
[im 25/100]
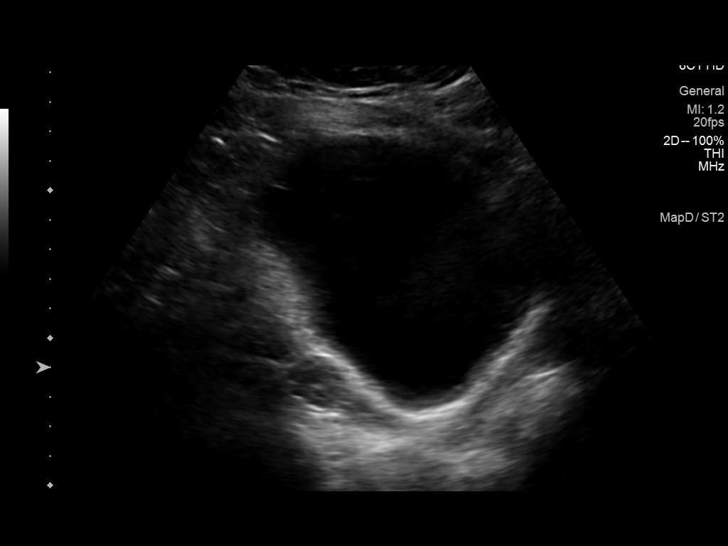
[im 34/100]
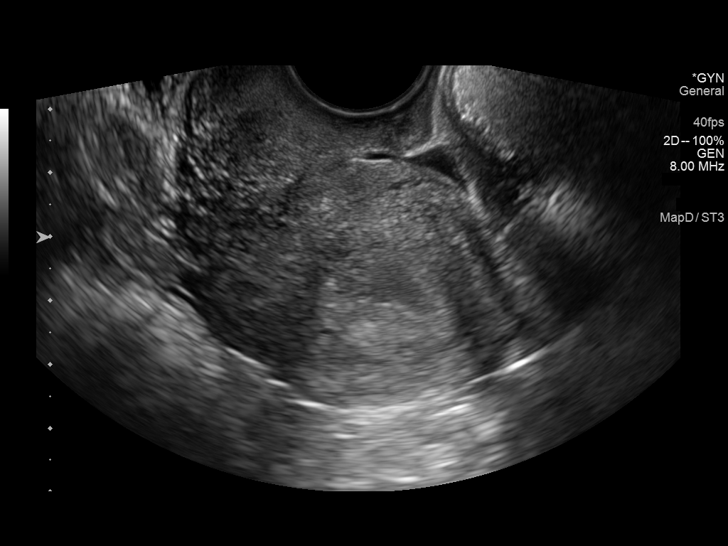
[im 42/100]
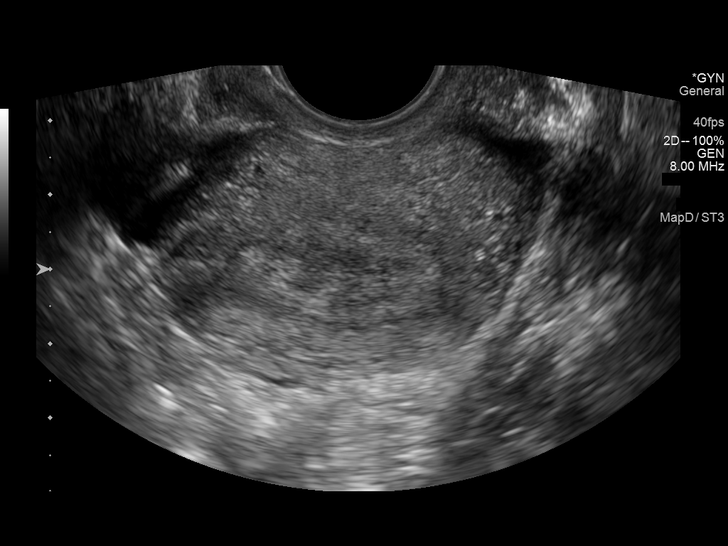
[im 50/100]
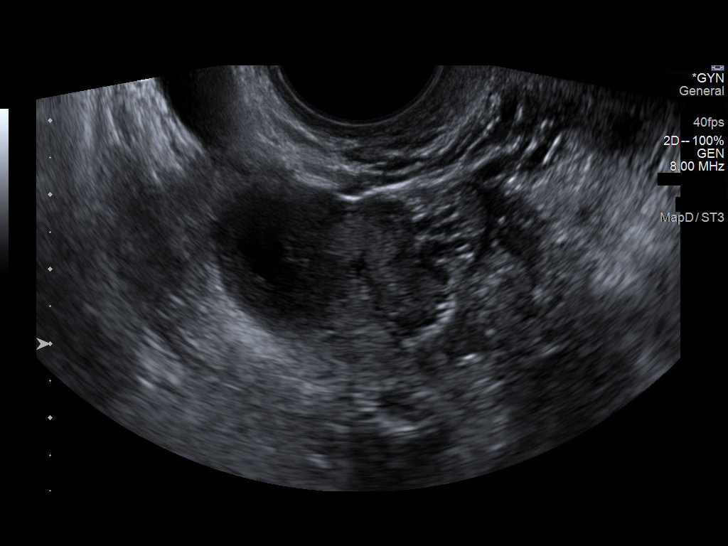
[im 58/100]
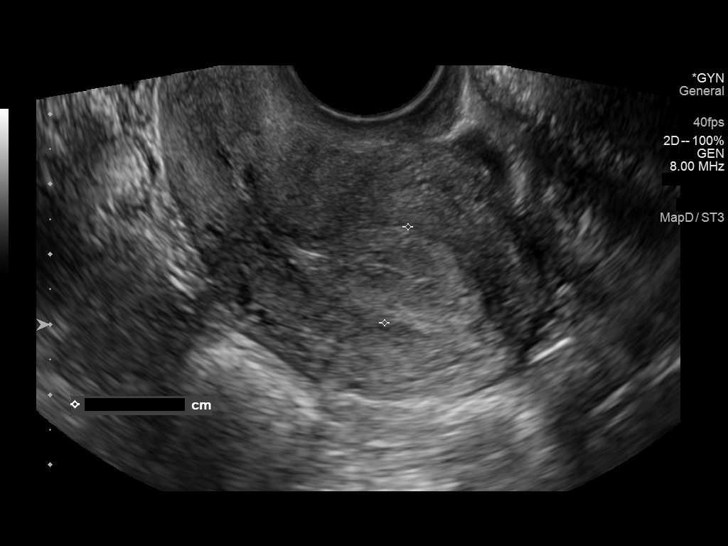
[im 67/100]
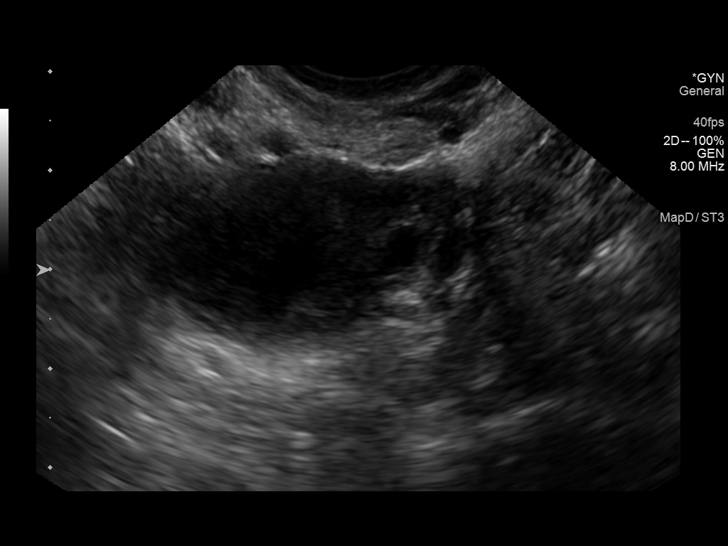
[im 75/100]
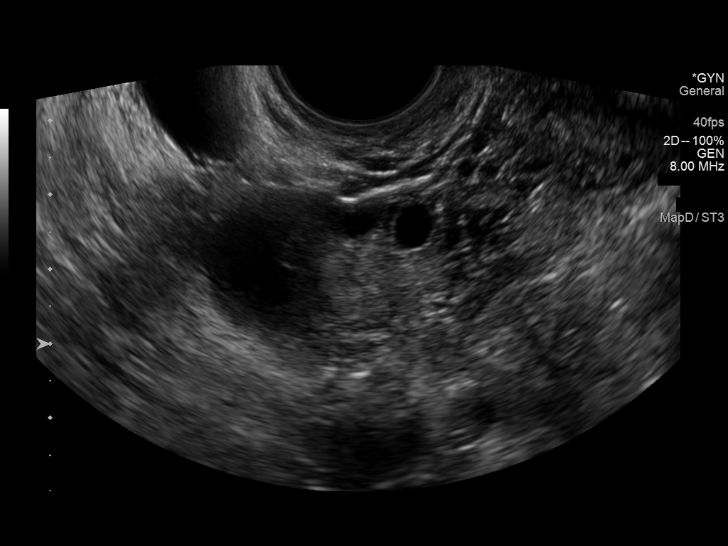
[im 83/100]
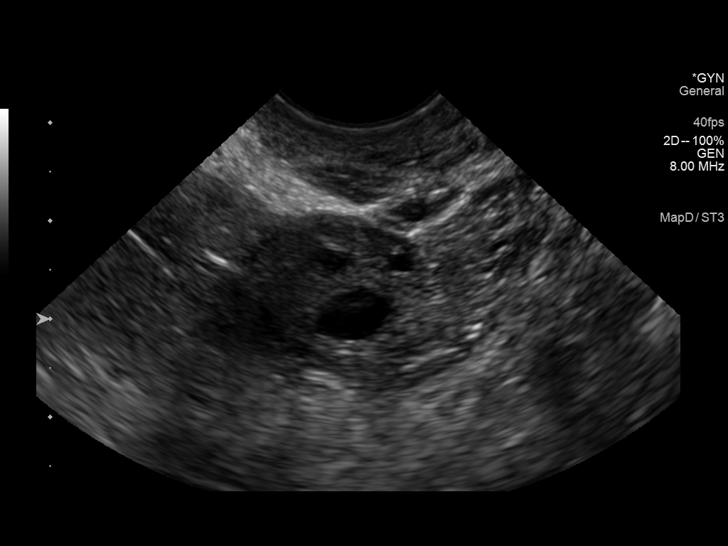
[im 91/100]
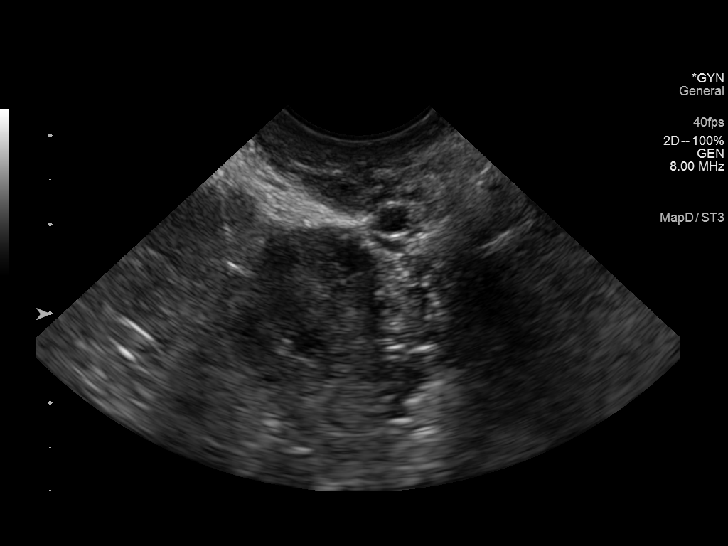
[im 100/100]
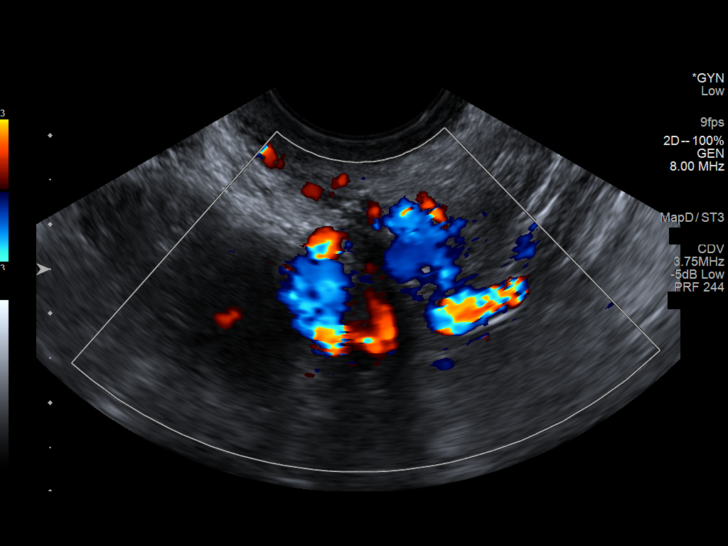

[13 of 25 positions shown; findings below may reference images not displayed]

FINDINGS: Uterus

Measurements: 6.4 x 4.1 x 5.1 cm = volume: 69 mL. Uterus is
retroverted. No fibroids or other mass visualized.

Endometrium

Thickness: 14 mm.  No focal abnormality visualized.

Right ovary

Measurements: 4.7 x 2.2 x 3.5 cm = volume: 19 mL. 1.7 x 1.4 x 0.9 cm
complex cystic lesion with peripherally increased vascularity,
likely a degenerating corpus luteal cyst. No other adnexal mass.

Left ovary

Measurements: 3.2 x 1.8 x 1.5 cm = volume: 5 mL. Normal
appearance/no adnexal mass.

Other findings

Small volume free fluid within the pelvis, presumably physiologic.
IMPRESSION: 1. 1.7 cm complex right ovarian cyst, favored to reflect a small
degenerating corpus luteal cyst.
2. No other discrete pelvic or adnexal mass. No sonographic evidence
for endometriosis.
3. Endometrial stripe measures 14 mm in thickness. If bleeding
remains unresponsive to hormonal or medical therapy, sonohysterogram
should be considered for focal lesion work-up. (Ref: Radiological
Reasoning: Algorithmic Workup of Abnormal Vaginal Bleeding with
Endovaginal Sonography and Sonohysterography. AJR [id number]; [id number]).

## 2022-03-07 ENCOUNTER — Encounter: Payer: Self-pay | Admitting: Internal Medicine

## 2022-03-13 ENCOUNTER — Other Ambulatory Visit: Payer: Self-pay | Admitting: Medical

## 2022-04-10 ENCOUNTER — Encounter: Payer: Self-pay | Admitting: Internal Medicine

## 2022-04-13 ENCOUNTER — Other Ambulatory Visit: Payer: Self-pay | Admitting: Medical

## 2022-04-13 NOTE — Telephone Encounter (Signed)
Med refill request for Buspirone last apt 09/15/21 next apt 04/18/22

## 2022-04-18 ENCOUNTER — Encounter: Payer: Self-pay | Admitting: Medical

## 2022-04-18 ENCOUNTER — Ambulatory Visit (INDEPENDENT_AMBULATORY_CARE_PROVIDER_SITE_OTHER): Payer: BC Managed Care – PPO | Admitting: Medical

## 2022-04-18 VITALS — BP 122/80 | HR 104 | Ht 62.0 in | Wt 185.0 lb

## 2022-04-18 DIAGNOSIS — Z0000 Encounter for general adult medical examination without abnormal findings: Secondary | ICD-10-CM | POA: Diagnosis not present

## 2022-04-18 DIAGNOSIS — J4520 Mild intermittent asthma, uncomplicated: Secondary | ICD-10-CM

## 2022-04-18 DIAGNOSIS — K921 Melena: Secondary | ICD-10-CM

## 2022-04-18 DIAGNOSIS — F411 Generalized anxiety disorder: Secondary | ICD-10-CM

## 2022-04-18 DIAGNOSIS — Z1329 Encounter for screening for other suspected endocrine disorder: Secondary | ICD-10-CM

## 2022-04-18 DIAGNOSIS — Z789 Other specified health status: Secondary | ICD-10-CM | POA: Diagnosis not present

## 2022-04-18 DIAGNOSIS — E611 Iron deficiency: Secondary | ICD-10-CM

## 2022-04-18 DIAGNOSIS — Z862 Personal history of diseases of the blood and blood-forming organs and certain disorders involving the immune mechanism: Secondary | ICD-10-CM

## 2022-04-18 DIAGNOSIS — Z8349 Family history of other endocrine, nutritional and metabolic diseases: Secondary | ICD-10-CM

## 2022-04-18 DIAGNOSIS — N920 Excessive and frequent menstruation with regular cycle: Secondary | ICD-10-CM

## 2022-04-18 DIAGNOSIS — Z13220 Encounter for screening for lipoid disorders: Secondary | ICD-10-CM | POA: Diagnosis not present

## 2022-04-18 DIAGNOSIS — E559 Vitamin D deficiency, unspecified: Secondary | ICD-10-CM

## 2022-04-18 DIAGNOSIS — G932 Benign intracranial hypertension: Secondary | ICD-10-CM

## 2022-04-18 DIAGNOSIS — Z131 Encounter for screening for diabetes mellitus: Secondary | ICD-10-CM | POA: Diagnosis not present

## 2022-04-18 DIAGNOSIS — K580 Irritable bowel syndrome with diarrhea: Secondary | ICD-10-CM

## 2022-04-18 DIAGNOSIS — K649 Unspecified hemorrhoids: Secondary | ICD-10-CM

## 2022-04-18 MED ORDER — BUSPIRONE HCL 5 MG PO TABS: ORAL_TABLET | ORAL | 1 refills | Status: DC

## 2022-04-18 MED ORDER — HYDROCORTISONE 2.5 % EX CREA: TOPICAL_CREAM | Freq: Two times a day (BID) | CUTANEOUS | 1 refills | Status: DC

## 2022-04-18 NOTE — Progress Notes (Signed)
Subjective:   HPI  Pamela Montoya is a 26 y.o. female who presents for Chief Complaint  Patient presents with   fasting cpe    Fasting cpe, would like a referral obgyn- no concerns. Will come back for flu and covid   Patient Care Team: Aaryn Sermon, Camelia Eng, PA-C as PCP - General (Family Medicine) Sees dentist Sees eye doctor, Dr. Argie Ramming, prior, but changing to new eye doctor [facility] Dr. Bjorn Loser, urology   Concerns: Been doing ok.  No major recent issues  Migraines have been better this year, much improved since graduating with masters.  Less stress now.  Uses sleep or ibuprofen.  Was on tandem originally, had stomach issues, but after switching to fergon, this really was better tolerated.  Every now and then gets some hemorrhoids and occasional blood in stool.  Does fine on Buspar to help with anxiety   Past Medical History:  Diagnosis Date   Anemia    Anxiety    Asthma    External hemorrhoid    IBS (irritable bowel syndrome)    Migraines    Pseudotumor cerebri 2007    Family History  Problem Relation Age of Onset   Heart disease Mother        valve disease   Hyperlipidemia Mother    Hypertension Father    Thyroid disease Father    Heart defect Sister    Cancer Maternal Grandmother    Heart attack Maternal Grandfather    Dementia Maternal Grandfather    Colon cancer Paternal Grandmother    Lung cancer Paternal Grandmother      Current Outpatient Medications:    albuterol (VENTOLIN HFA) 108 (90 Base) MCG/ACT inhaler, Inhale 2 puffs into the lungs every 6 (six) hours as needed for wheezing or shortness of breath., Disp: 18 g, Rfl: 0   ferrous gluconate (FERGON) 324 MG tablet, TAKE 1 TABLET(324 MG) BY MOUTH DAILY WITH BREAKFAST, Disp: 90 tablet, Rfl: 0   hydrocortisone 2.5 % cream, Apply topically 2 (two) times daily., Disp: 30 g, Rfl: 1   UNABLE TO FIND, Lavender supplement, Disp: , Rfl:    busPIRone (BUSPAR) 5 MG tablet, TAKE 1 TABLET(5  MG) BY MOUTH TWICE DAILY, Disp: 180 tablet, Rfl: 1  Allergies  Allergen Reactions   Ciprofloxacin     pain   Meloxicam     Dizzy     Reviewed their medical, surgical, family, social, medication, and allergy history and updated chart as appropriate.   Review of Systems Constitutional: -fever, -chills, -sweats, -unexpected weight change, -decreased appetite, -fatigue Allergy: -sneezing, -itching, -congestion Dermatology: -changing moles, --rash, -lumps ENT: -runny nose, -ear pain, -sore throat, -hoarseness, -sinus pain, -teeth pain, - ringing in ears, -hearing loss, -nosebleeds Cardiology: -chest pain, -palpitations, -swelling, -difficulty breathing when lying flat, -waking up short of breath Respiratory: -cough, -shortness of breath, -difficulty breathing with exercise or exertion, -wheezing, -coughing up blood Gastroenterology: -abdominal pain, -nausea, -vomiting, -diarrhea, -constipation, +blood in stool, -changes in bowel movement, -difficulty swallowing or eating Hematology: -bleeding, -bruising  Musculoskeletal: -joint aches, -muscle aches, -joint swelling, -back pain, -neck pain, -cramping, -changes in gait Ophthalmology: denies vision changes, eye redness, itching, discharge Urology: -burning with urination, -difficulty urinating, -blood in urine, -urinary frequency, -urgency, -incontinence Neurology: -headache, -weakness, -tingling, -numbness, -memory loss, -falls, -dizziness Psychology: -depressed mood, -agitation, -sleep problems Breast/gyn: -breast tendnerss, -discharge, -lumps, -vaginal discharge,- irregular periods, -heavy periods      04/18/2022    1:42 PM 09/15/2021    1:54 PM  03/27/2021    3:07 PM 06/28/2020   11:34 AM 06/07/2020   11:52 AM  Depression screen PHQ 2/9  Decreased Interest 0 0 0 0 0  Down, Depressed, Hopeless 0 0 0 0 0  PHQ - 2 Score 0 0 0 0 0       Objective:  BP 122/80   Pulse (!) 104   Ht '5\' 2"'  (1.575 m)   Wt 185 lb (83.9 kg)   LMP  03/28/2022   BMI 33.84 kg/m   Wt Readings from Last 3 Encounters:  04/18/22 185 lb (83.9 kg)  09/15/21 191 lb 3.2 oz (86.7 kg)  07/27/21 185 lb (83.9 kg)   BP Readings from Last 3 Encounters:  04/18/22 122/80  09/15/21 120/78  03/27/21 130/80   General appearance: alert, no distress, WD/WN, Caucasian female Skin: no worrisome lesions HEENT: normocephalic, conjunctiva/corneas normal, sclerae anicteric, PERRLA, EOMi, nares patent, no discharge or erythema, pharynx normal Oral cavity: MMM, tongue normal, teeth normal Neck: supple, no lymphadenopathy, no thyromegaly, no masses, normal ROM, no bruits Chest: non tender, normal shape and expansion Heart: RRR, normal S1, S2, no murmurs Lungs: CTA bilaterally, no wheezes, rhonchi, or rales Abdomen: +bs, soft, non tender, non distended, no masses, no hepatomegaly, no splenomegaly, no bruits Back: non tender, normal ROM, no scoliosis Musculoskeletal: upper extremities non tender, no obvious deformity, normal ROM throughout, lower extremities non tender, no obvious deformity, normal ROM throughout Extremities: no edema, no cyanosis, no clubbing Pulses: 2+ symmetric, upper and lower extremities, normal cap refill Neurological: alert, oriented x 3, CN2-12 intact, strength normal upper extremities and lower extremities, sensation normal throughout, DTRs 2+ throughout, no cerebellar signs, gait normal Psychiatric: normal affect, behavior normal, pleasant  breast/gyn/rectal - deferred to gynecology     Assessment and Plan :   Encounter Diagnoses  Name Primary?   Encounter for health maintenance examination in adult Yes   Vitamin D deficiency    Vegetarian    Iron deficiency    Family history of hemochromatosis    Generalized anxiety disorder    Hemorrhoids, unspecified hemorrhoid type    History of anemia    Irritable bowel syndrome with diarrhea    Blood in stool    Screening for lipid disorders    Screening for thyroid disorder     Menorrhagia with regular cycle    Mild intermittent asthma, unspecified whether complicated    Pseudotumor cerebri      This visit was a preventative care visit, also known as wellness visit or routine physical.   Topics typically include healthy lifestyle, diet, exercise, preventative care, vaccinations, sick and well care, proper use of emergency dept and after hours care, as well as other concerns.     Recommendations: Continue to return yearly for your annual wellness and preventative care visits.  This gives [place] a chance to discuss healthy lifestyle, exercise, vaccinations, review your chart record, and perform screenings where appropriate.  I recommend you see your eye doctor yearly for routine vision care.  I recommend you see your dentist yearly for routine dental care including hygiene visits twice yearly.   Vaccination recommendations were reviewed Immunization History  Administered Date(s) Administered   DTaP 03/05/1996, 05/08/1996, 07/10/1996, 05/05/1997   HIB (PRP-OMP) 03/05/1996, 05/08/1996, 07/10/1996, 01/21/1997   HPV Quadrivalent 03/08/2009, 07/14/2009, 09/15/2009   Hepatitis A 10/10/2006, 03/08/2009   Hepatitis B 1995/09/19, 02/10/1996, 10/08/1996   IPV 03/05/1996, 05/08/1996, 05/05/1997, 11/13/2000   Influenza Split 04/04/2021   Influenza,inj,Quad PF,6+ Mos 05/06/2015  Influenza-Unspecified 04/13/2020   MMR 01/21/1997, 11/13/2000   Meningococcal Conjugate 03/08/2009   PFIZER(Purple Top)SARS-COV-2 Vaccination 10/01/2019, 10/22/2019, 09/05/2020   Pfizer Covid-19 Vaccine Bivalent Booster 48yr & up 04/04/2021   Tdap 10/10/2006, 12/24/2016   Varicella 01/21/1997, 10/10/2006   She will return separately for flu and covid vaccines   Screening for cancer: Colon cancer screening: Age 2037with GI  Breast cancer screening: You should perform a self breast exam monthly.   We reviewed recommendations for regular mammograms and breast cancer screening.  Cervical  cancer screening: We reviewed recommendations for pap smear screening.   Skin cancer screening: Check your skin regularly for new changes, growing lesions, or other lesions of concern Come in for evaluation if you have skin lesions of concern.  Lung cancer screening: If you have a greater than 20 pack year history of tobacco use, then you may qualify for lung cancer screening with a chest CT scan.   Please call your insurance company to inquire about coverage for this test.  We currently don't have screenings for other cancers besides breast, cervical, colon, and lung cancers.  If you have a strong family history of cancer or have other cancer screening concerns, please let me know.    Bone health: Get at least 150 minutes of aerobic exercise weekly Get weight bearing exercise at least once weekly Bone density test:  A bone density test is an imaging test that uses a type of X-ray to measure the amount of calcium and other minerals in your bones. The test may be used to diagnose or screen you for a condition that causes weak or thin bones (osteoporosis), predict your risk for a broken bone (fracture), or determine how well your osteoporosis treatment is working. The bone density test is recommended for females 658and older, or females or males <<16if certain risk factors such as thyroid disease, long term use of steroids such as for asthma or rheumatological issues, vitamin D deficiency, estrogen deficiency, family history of osteoporosis, self or family history of fragility fracture in first degree relative.    Heart health: Get at least 150 minutes of aerobic exercise weekly Limit alcohol It is important to maintain a healthy blood pressure and healthy cholesterol numbers  Heart disease screening: Screening for heart disease includes screening for blood pressure, fasting lipids, glucose/diabetes screening, BMI height to weight ratio, reviewed of smoking status, physical activity, and  diet.    Goals include blood pressure 120/80 or less, maintaining a healthy lipid/cholesterol profile, preventing diabetes or keeping diabetes numbers under good control, not smoking or using tobacco products, exercising most days per week or at least 150 minutes per week of exercise, and eating healthy variety of fruits and vegetables, healthy oils, and avoiding unhealthy food choices like fried food, fast food, high sugar and high cholesterol foods.    Other tests may possibly include EKG test, CT coronary calcium score, echocardiogram, exercise treadmill stress test.    Medical care options: I recommend you continue to seek care here first for routine care.  We try really hard to have available appointments Monday through Friday daytime hours for sick visits, acute visits, and physicals.  Urgent care should be used for after hours and weekends for significant issues that cannot wait till the next day.  The emergency department should be used for significant potentially life-threatening emergencies.  The emergency department is expensive, can often have long wait times for less significant concerns, so try to utilize primary care, urgent care,  or telemedicine when possible to avoid unnecessary trips to the emergency department.  Virtual visits and telemedicine have been introduced since the pandemic started in 2020, and can be convenient ways to receive medical care.  We offer virtual appointments as well to assist you in a variety of options to seek medical care.   Separate significant issues discussed: Updated labs today  History of iron deficiency anemia-updated labs, currently on Fergon daily and tolerating this well  External hemorrhoids, IBS-she saw GI this past year.  Continue efforts with good water intake, fiber supplement which she is not doing currently.  We discussed acute therapy for hemorrhoids.  Steroid cream as needed.  Anxiety-doing fine on BuSpar  Asthma-no recent  problems  Pseudotumor cerebri and migraines-headaches are much improved and no major issues of late   Arrie was seen today for fasting cpe.  Diagnoses and all orders for this visit:  Encounter for health maintenance examination in adult -     Comprehensive metabolic panel -     CBC with Differential/Platelet -     TSH -     Lipid panel -     Iron, TIBC and Ferritin Panel -     VITAMIN D 25 Hydroxy (Vit-D Deficiency, Fractures) -     Hemoglobin A1c  Vitamin D deficiency -     VITAMIN D 25 Hydroxy (Vit-D Deficiency, Fractures)  Vegetarian  Iron deficiency -     CBC with Differential/Platelet -     Iron, TIBC and Ferritin Panel  Family history of hemochromatosis  Generalized anxiety disorder  Hemorrhoids, unspecified hemorrhoid type  History of anemia -     CBC with Differential/Platelet -     Iron, TIBC and Ferritin Panel  Irritable bowel syndrome with diarrhea  Blood in stool  Screening for lipid disorders -     Lipid panel  Screening for thyroid disorder -     TSH  Menorrhagia with regular cycle -     Ambulatory referral to Gynecology  Mild intermittent asthma, unspecified whether complicated  Pseudotumor cerebri  Other orders -     busPIRone (BUSPAR) 5 MG tablet; TAKE 1 TABLET(5 MG) BY MOUTH TWICE DAILY -     hydrocortisone 2.5 % cream; Apply topically 2 (two) times daily.   Follow-up pending labs, yearly for physical

## 2022-04-19 ENCOUNTER — Other Ambulatory Visit: Payer: Self-pay | Admitting: Medical

## 2022-04-19 DIAGNOSIS — E789 Disorder of lipoprotein metabolism, unspecified: Secondary | ICD-10-CM

## 2022-04-19 LAB — CBC WITH DIFFERENTIAL/PLATELET
Basophils Absolute: 0.1 10*3/uL (ref 0.0–0.2)
Basos: 1 %
EOS (ABSOLUTE): 0.1 10*3/uL (ref 0.0–0.4)
Eos: 1 %
Hematocrit: 37.9 % (ref 34.0–46.6)
Hemoglobin: 12.4 g/dL (ref 11.1–15.9)
Immature Grans (Abs): 0 10*3/uL (ref 0.0–0.1)
Immature Granulocytes: 0 %
Lymphocytes Absolute: 3.3 10*3/uL — ABNORMAL HIGH (ref 0.7–3.1)
Lymphs: 40 %
MCH: 29.8 pg (ref 26.6–33.0)
MCHC: 32.7 g/dL (ref 31.5–35.7)
MCV: 91 fL (ref 79–97)
Monocytes Absolute: 0.5 10*3/uL (ref 0.1–0.9)
Monocytes: 6 %
Neutrophils Absolute: 4.2 10*3/uL (ref 1.4–7.0)
Neutrophils: 52 %
Platelets: 335 10*3/uL (ref 150–450)
RBC: 4.16 x10E6/uL (ref 3.77–5.28)
RDW: 12.3 % (ref 11.7–15.4)
WBC: 8.2 10*3/uL (ref 3.4–10.8)

## 2022-04-19 LAB — HEMOGLOBIN A1C
Est. average glucose Bld gHb Est-mCnc: 97 mg/dL
Hgb A1c MFr Bld: 5 % (ref 4.8–5.6)

## 2022-04-19 LAB — COMPREHENSIVE METABOLIC PANEL
ALT: 31 IU/L (ref 0–32)
AST: 22 IU/L (ref 0–40)
Albumin/Globulin Ratio: 1.6 (ref 1.2–2.2)
Albumin: 4.4 g/dL (ref 4.0–5.0)
Alkaline Phosphatase: 97 IU/L (ref 44–121)
BUN/Creatinine Ratio: 17 (ref 9–23)
BUN: 11 mg/dL (ref 6–20)
Bilirubin Total: <0.2 mg/dL (ref 0.0–1.2)
CO2: 23 mmol/L (ref 20–29)
Calcium: 9.8 mg/dL (ref 8.7–10.2)
Chloride: 100 mmol/L (ref 96–106)
Creatinine, Ser: 0.64 mg/dL (ref 0.57–1.00)
Globulin, Total: 2.8 g/dL (ref 1.5–4.5)
Glucose: 86 mg/dL (ref 70–99)
Potassium: 4.4 mmol/L (ref 3.5–5.2)
Sodium: 139 mmol/L (ref 134–144)
Total Protein: 7.2 g/dL (ref 6.0–8.5)
eGFR: 125 mL/min/{1.73_m2} (ref >59)

## 2022-04-19 LAB — IRON,TIBC AND FERRITIN PANEL
Ferritin: 39 ng/mL (ref 15–150)
Iron Saturation: 20 % (ref 15–55)
Iron: 60 ug/dL (ref 27–159)
Total Iron Binding Capacity: 297 ug/dL (ref 250–450)
UIBC: 237 ug/dL (ref 131–425)

## 2022-04-19 LAB — LIPID PANEL
Chol/HDL Ratio: 5.3 ratio — ABNORMAL HIGH (ref 0.0–4.4)
Cholesterol, Total: 176 mg/dL (ref 100–199)
HDL: 33 mg/dL — ABNORMAL LOW (ref >39)
LDL Chol Calc (NIH): 109 mg/dL — ABNORMAL HIGH (ref 0–99)
Triglycerides: 193 mg/dL — ABNORMAL HIGH (ref 0–149)
VLDL Cholesterol Cal: 34 mg/dL (ref 5–40)

## 2022-04-19 LAB — TSH: TSH: 4.39 u[IU]/mL (ref 0.450–4.500)

## 2022-04-19 LAB — VITAMIN D 25 HYDROXY (VIT D DEFICIENCY, FRACTURES): Vit D, 25-Hydroxy: 29.9 ng/mL — ABNORMAL LOW (ref 30.0–100.0)

## 2022-04-19 MED ORDER — VITAMIN D 50 MCG (2000 UT) PO CAPS: 1.0000 | ORAL_CAPSULE | Freq: Every day | ORAL | 3 refills | Status: AC

## 2022-05-11 DIAGNOSIS — Z6835 Body mass index (BMI) 35.0-35.9, adult: Secondary | ICD-10-CM | POA: Diagnosis not present

## 2022-05-11 DIAGNOSIS — N920 Excessive and frequent menstruation with regular cycle: Secondary | ICD-10-CM | POA: Diagnosis not present

## 2022-06-07 ENCOUNTER — Encounter: Payer: Self-pay | Admitting: Medical

## 2022-06-07 DIAGNOSIS — Z131 Encounter for screening for diabetes mellitus: Secondary | ICD-10-CM | POA: Insufficient documentation

## 2022-06-12 ENCOUNTER — Ambulatory Visit: Payer: BC Managed Care – PPO | Admitting: Medical

## 2022-06-17 ENCOUNTER — Other Ambulatory Visit: Payer: Self-pay | Admitting: Medical

## 2022-06-21 DIAGNOSIS — G932 Benign intracranial hypertension: Secondary | ICD-10-CM | POA: Diagnosis not present

## 2022-06-21 DIAGNOSIS — H47333 Pseudopapilledema of optic disc, bilateral: Secondary | ICD-10-CM | POA: Diagnosis not present

## 2022-06-28 DIAGNOSIS — F411 Generalized anxiety disorder: Secondary | ICD-10-CM | POA: Diagnosis not present

## 2022-07-05 ENCOUNTER — Other Ambulatory Visit: Payer: Self-pay | Admitting: Medical

## 2022-07-06 DIAGNOSIS — F411 Generalized anxiety disorder: Secondary | ICD-10-CM | POA: Diagnosis not present

## 2022-07-10 DIAGNOSIS — Z6835 Body mass index (BMI) 35.0-35.9, adult: Secondary | ICD-10-CM | POA: Diagnosis not present

## 2022-07-10 DIAGNOSIS — N920 Excessive and frequent menstruation with regular cycle: Secondary | ICD-10-CM | POA: Diagnosis not present

## 2022-07-10 DIAGNOSIS — Z01419 Encounter for gynecological examination (general) (routine) without abnormal findings: Secondary | ICD-10-CM | POA: Diagnosis not present

## 2022-07-10 DIAGNOSIS — Z124 Encounter for screening for malignant neoplasm of cervix: Secondary | ICD-10-CM | POA: Diagnosis not present

## 2022-07-19 DIAGNOSIS — F411 Generalized anxiety disorder: Secondary | ICD-10-CM | POA: Diagnosis not present

## 2022-07-26 DIAGNOSIS — F411 Generalized anxiety disorder: Secondary | ICD-10-CM | POA: Diagnosis not present

## 2022-08-02 DIAGNOSIS — F411 Generalized anxiety disorder: Secondary | ICD-10-CM | POA: Diagnosis not present

## 2022-08-15 ENCOUNTER — Ambulatory Visit: Payer: BC Managed Care – PPO | Admitting: Medical

## 2022-08-16 DIAGNOSIS — F411 Generalized anxiety disorder: Secondary | ICD-10-CM | POA: Diagnosis not present

## 2022-09-05 DIAGNOSIS — F411 Generalized anxiety disorder: Secondary | ICD-10-CM | POA: Diagnosis not present

## 2022-10-26 ENCOUNTER — Ambulatory Visit: Payer: BC Managed Care – PPO | Admitting: Medical

## 2022-11-02 ENCOUNTER — Ambulatory Visit: Payer: BC Managed Care – PPO | Admitting: Medical

## 2022-11-09 ENCOUNTER — Ambulatory Visit (INDEPENDENT_AMBULATORY_CARE_PROVIDER_SITE_OTHER): Payer: BC Managed Care – PPO | Admitting: Medical

## 2022-11-09 VITALS — BP 120/78 | HR 98 | Wt 167.2 lb

## 2022-11-09 DIAGNOSIS — R42 Dizziness and giddiness: Secondary | ICD-10-CM

## 2022-11-09 DIAGNOSIS — E559 Vitamin D deficiency, unspecified: Secondary | ICD-10-CM | POA: Diagnosis not present

## 2022-11-09 DIAGNOSIS — E782 Mixed hyperlipidemia: Secondary | ICD-10-CM

## 2022-11-09 NOTE — Progress Notes (Signed)
Subjective:  Pamela Montoya is a 27 y.o. female who presents for Chief Complaint  Patient presents with   Hyperlipidemia    Discuss cholesterol-      Here for recheck on lipids and vitamin D from last visit.   Is vegetarian long term.   After last visit cut significantly down on cheese and pasta, lost about 20 lb or so with these diet changes.   [name]  Non fasting today  Last visit vit D was low.  She thinks she is doing vitamin D 1000 unit daily supplement  Has been having problems with lightheaded for a few weeks.  Felt faint at a museum one day.  Has had a cold this week but has been feeling nauseated, dizzy.  In the past felt this way when low iron.   Menstrual periods are not heavy.  Periods are regular.  Taking iron daily.  Can't take birth control due to pseudotumor cerebri.  No palpations, no chest pain.  No edema. Gets flushed or hot at times.   Thought it could even be panic attack.  No recent skin changes.  Sometimes has vertigo symptoms.  Drinks a lot of water daily.  No other aggravating or relieving factors.    No other c/o.  Past Medical History:  Diagnosis Date   Anemia    Anxiety    Asthma    External hemorrhoid    IBS (irritable bowel syndrome)    Migraines    Pseudotumor cerebri 2007   Current Outpatient Medications on File Prior to Visit  Medication Sig Dispense Refill   albuterol (VENTOLIN HFA) 108 (90 Base) MCG/ACT inhaler Inhale 2 puffs into the lungs every 6 (six) hours as needed for wheezing or shortness of breath. 18 g 0   busPIRone (BUSPAR) 5 MG tablet TAKE 1 TABLET(5 MG) BY MOUTH TWICE DAILY 180 tablet 1   Cholecalciferol (VITAMIN D) 50 MCG (2000 UT) CAPS Take 1 capsule (2,000 Units total) by mouth daily. 90 capsule 3   ferrous gluconate (FERGON) 324 MG tablet TAKE 1 TABLET(324 MG) BY MOUTH DAILY WITH BREAKFAST 90 tablet 1   UNABLE TO FIND Lavender supplement     No current facility-administered medications on file prior to  visit.   Family History  Problem Relation Age of Onset   Heart disease Mother        valve disease   Hyperlipidemia Mother    Hypertension Father    Thyroid disease Father    Heart defect Sister    Cancer Maternal Grandmother    Heart attack Maternal Grandfather    Dementia Maternal Grandfather    Colon cancer Paternal Grandmother    Lung cancer Paternal Grandmother      The following portions of the patient's history were reviewed and updated as appropriate: allergies, current medications, past family history, past medical history, past social history, past surgical history and problem list.  ROS Otherwise as in subjective above    Objective: BP 120/78   Pulse 98   Wt 167 lb 3.2 oz (75.8 kg)   BMI 30.58 kg/m   Wt Readings from Last 3 Encounters:  11/09/22 167 lb 3.2 oz (75.8 kg)  04/18/22 185 lb (83.9 kg)  09/15/21 191 lb 3.2 oz (86.7 kg)    General appearance: alert, no distress, well developed, well nourished HEENT: normocephalic, sclerae anicteric, conjunctiva pink and moist, TMs pearly, nares patent, no discharge or erythema, pharynx normal Oral cavity: MMM, no lesions Neck: supple, no  lymphadenopathy, no thyromegaly, no masses, no bruits Heart: RRR, normal S1, S2, no murmurs Lungs: CTA bilaterally, no wheezes, rhonchi, or rales Pulses: 2+ radial pulses, 2+ pedal pulses, normal cap refill Ext: no edema Neuro: CN II to XII intact, nonfocal exam    Assessment: Encounter Diagnoses  Name Primary?   Mixed dyslipidemia Yes   Vitamin D deficiency    Lightheaded    Dizziness      Plan: Mixed dyslipidemia  She significantly cut down on cheese and pasta after last visit.  Is vegetarian long term.   She is non fasting today She will return for fasting labs soon Counseled on low HDL, elevated TRIG, counseled on diet and exercise  Vitamin D deficiency - compliant with OTC supplement.   She will return for lab in the next few  weeks  Lightheaded Orthostatics normal She has some component of vertigo.  We discussed over-the-counter meclizine use if needed if she gets vertigo symptoms again as discussed. She has been having a lot of stress with some this could be anxiety related.   She declines recheck on blood today including CBC and be met.  If she continues to get lightheaded feeling we will pursue those labs and possibly EKG but no obvious signs or symptoms suggesting a more serious cause today  Pamela Montoya was seen today for hyperlipidemia.  Diagnoses and all orders for this visit:  Mixed dyslipidemia -     Lipid panel; Future  Vitamin D deficiency -     VITAMIN D 25 Hydroxy (Vit-D Deficiency, Fractures); Future  Lightheaded -     Orthostatic vital signs  Dizziness -     Orthostatic vital signs    Follow up: in a few weeks for fasting labs

## 2022-11-15 DIAGNOSIS — F411 Generalized anxiety disorder: Secondary | ICD-10-CM | POA: Diagnosis not present

## 2022-11-23 ENCOUNTER — Other Ambulatory Visit: Payer: BC Managed Care – PPO

## 2022-11-23 DIAGNOSIS — E782 Mixed hyperlipidemia: Secondary | ICD-10-CM | POA: Diagnosis not present

## 2022-11-23 DIAGNOSIS — E559 Vitamin D deficiency, unspecified: Secondary | ICD-10-CM | POA: Diagnosis not present

## 2022-11-24 LAB — VITAMIN D 25 HYDROXY (VIT D DEFICIENCY, FRACTURES): Vit D, 25-Hydroxy: 36.8 ng/mL (ref 30.0–100.0)

## 2022-11-24 LAB — LIPID PANEL
Chol/HDL Ratio: 4.1 ratio (ref 0.0–4.4)
Cholesterol, Total: 143 mg/dL (ref 100–199)
HDL: 35 mg/dL — ABNORMAL LOW (ref >39)
LDL Chol Calc (NIH): 80 mg/dL (ref 0–99)
Triglycerides: 159 mg/dL — ABNORMAL HIGH (ref 0–149)
VLDL Cholesterol Cal: 28 mg/dL (ref 5–40)

## 2022-11-27 NOTE — Progress Notes (Signed)
Results sent through MyChart

## 2022-12-20 DIAGNOSIS — G932 Benign intracranial hypertension: Secondary | ICD-10-CM | POA: Diagnosis not present

## 2022-12-20 DIAGNOSIS — H47333 Pseudopapilledema of optic disc, bilateral: Secondary | ICD-10-CM | POA: Diagnosis not present

## 2022-12-27 ENCOUNTER — Other Ambulatory Visit: Payer: Self-pay | Admitting: Medical

## 2023-01-24 DIAGNOSIS — F411 Generalized anxiety disorder: Secondary | ICD-10-CM | POA: Diagnosis not present

## 2023-03-07 DIAGNOSIS — Z124 Encounter for screening for malignant neoplasm of cervix: Secondary | ICD-10-CM | POA: Diagnosis not present

## 2023-03-07 DIAGNOSIS — M25661 Stiffness of right knee, not elsewhere classified: Secondary | ICD-10-CM | POA: Diagnosis not present

## 2023-03-07 DIAGNOSIS — F411 Generalized anxiety disorder: Secondary | ICD-10-CM | POA: Diagnosis not present

## 2023-03-07 DIAGNOSIS — D649 Anemia, unspecified: Secondary | ICD-10-CM | POA: Diagnosis not present

## 2023-03-07 DIAGNOSIS — M542 Cervicalgia: Secondary | ICD-10-CM | POA: Diagnosis not present

## 2023-03-15 DIAGNOSIS — Z0000 Encounter for general adult medical examination without abnormal findings: Secondary | ICD-10-CM | POA: Diagnosis not present

## 2023-03-15 DIAGNOSIS — M25661 Stiffness of right knee, not elsewhere classified: Secondary | ICD-10-CM | POA: Diagnosis not present

## 2023-03-15 DIAGNOSIS — M25662 Stiffness of left knee, not elsewhere classified: Secondary | ICD-10-CM | POA: Diagnosis not present

## 2023-03-15 DIAGNOSIS — D649 Anemia, unspecified: Secondary | ICD-10-CM | POA: Diagnosis not present

## 2023-04-19 DIAGNOSIS — M25662 Stiffness of left knee, not elsewhere classified: Secondary | ICD-10-CM | POA: Diagnosis not present

## 2023-04-19 DIAGNOSIS — M25661 Stiffness of right knee, not elsewhere classified: Secondary | ICD-10-CM | POA: Diagnosis not present

## 2023-04-19 DIAGNOSIS — M542 Cervicalgia: Secondary | ICD-10-CM | POA: Diagnosis not present

## 2023-04-24 ENCOUNTER — Encounter: Payer: BC Managed Care – PPO | Admitting: Medical

## 2023-05-03 DIAGNOSIS — M25662 Stiffness of left knee, not elsewhere classified: Secondary | ICD-10-CM | POA: Diagnosis not present

## 2023-05-03 DIAGNOSIS — M542 Cervicalgia: Secondary | ICD-10-CM | POA: Diagnosis not present

## 2023-05-03 DIAGNOSIS — M25661 Stiffness of right knee, not elsewhere classified: Secondary | ICD-10-CM | POA: Diagnosis not present

## 2023-06-21 DIAGNOSIS — M25662 Stiffness of left knee, not elsewhere classified: Secondary | ICD-10-CM | POA: Diagnosis not present

## 2023-06-21 DIAGNOSIS — M542 Cervicalgia: Secondary | ICD-10-CM | POA: Diagnosis not present

## 2023-06-21 DIAGNOSIS — M25661 Stiffness of right knee, not elsewhere classified: Secondary | ICD-10-CM | POA: Diagnosis not present

## 2023-06-22 ENCOUNTER — Other Ambulatory Visit: Payer: Self-pay | Admitting: Medical

## 2023-09-30 ENCOUNTER — Other Ambulatory Visit: Payer: Self-pay | Admitting: Medical
